# Patient Record
Sex: Female | Born: 1995 | Race: Black or African American | Hispanic: No | Marital: Single | Smoking: Former smoker
Health system: Southern US, Community
[De-identification: ages and names within clinical notes are randomized; demographics above are authoritative.]

## PROBLEM LIST (undated history)

## (undated) DIAGNOSIS — A6000 Herpesviral infection of urogenital system, unspecified: Secondary | ICD-10-CM

## (undated) DIAGNOSIS — Z789 Other specified health status: Secondary | ICD-10-CM

## (undated) DIAGNOSIS — F419 Anxiety disorder, unspecified: Secondary | ICD-10-CM

## (undated) DIAGNOSIS — O139 Gestational [pregnancy-induced] hypertension without significant proteinuria, unspecified trimester: Secondary | ICD-10-CM

## (undated) HISTORY — DX: Herpesviral infection of urogenital system, unspecified: A60.00

## (undated) HISTORY — PX: NO PAST SURGERIES: SHX2092

---

## 2014-06-17 LAB — OB RESULTS CONSOLE HIV ANTIBODY (ROUTINE TESTING): HIV: NONREACTIVE

## 2014-06-17 LAB — OB RESULTS CONSOLE GC/CHLAMYDIA
CHLAMYDIA, DNA PROBE: NEGATIVE
Gonorrhea: NEGATIVE

## 2014-06-17 LAB — OB RESULTS CONSOLE ABO/RH: RH Type: POSITIVE

## 2014-06-17 LAB — OB RESULTS CONSOLE RPR: RPR: NONREACTIVE

## 2014-06-17 LAB — OB RESULTS CONSOLE HEPATITIS B SURFACE ANTIGEN: Hepatitis B Surface Ag: NEGATIVE

## 2014-06-17 LAB — OB RESULTS CONSOLE RUBELLA ANTIBODY, IGM: RUBELLA: NON-IMMUNE/NOT IMMUNE

## 2014-06-17 LAB — OB RESULTS CONSOLE ANTIBODY SCREEN: ANTIBODY SCREEN: NEGATIVE

## 2014-06-28 DIAGNOSIS — N12 Tubulo-interstitial nephritis, not specified as acute or chronic: Secondary | ICD-10-CM

## 2014-06-28 HISTORY — DX: Tubulo-interstitial nephritis, not specified as acute or chronic: N12

## 2014-06-28 NOTE — L&D Delivery Note (Signed)
Dr. Richardson Dopp arrived to room and pt noted to be +3 station. With good pushing effort, she went on to deliver w/o the need for operative delivery.  Elevated BPs reviewed w/ Dr. Richardson Dopp. Preeclampsia labs WNL. Informed pt that per Dr. Richardson Dopp will monitor BPs closely in the immediate pp period. If remain elevated, will transfer to AICU for Magnesium Sulfate therapy, otherwise she will go to the postpartum floor. Pt. Verbalized understanding.  Delivery Note At 6:18 AM a viable female "Julie Finley" was delivered via Vaginal, Spontaneous Delivery by Dr. Richardson Dopp (Presentation: OA restituting to LOA).  APGARS: 9, 9; weight pending.   Placenta status: Intact, Spontaneous Sent to pathology due to calcification and marginal insertion.  Cord: 3 vessels with the following complications: None.  Cord pH: NA  Anesthesia: Epidural  Episiotomy: None Lacerations: Left Periurethral Suture Repair: 3.0 vicryl SH Est. Blood Loss (mL): 200  Mom to postpartum.  Baby to Couplet care / Skin to Skin.   Plans outpatient circumcision.  Sherre Scarlet 12/07/2014, 6:53 AM

## 2014-07-12 ENCOUNTER — Other Ambulatory Visit (HOSPITAL_COMMUNITY): Payer: Self-pay | Admitting: Obstetrics & Gynecology

## 2014-07-12 DIAGNOSIS — R772 Abnormality of alphafetoprotein: Secondary | ICD-10-CM

## 2014-07-18 ENCOUNTER — Ambulatory Visit (HOSPITAL_COMMUNITY)
Admission: RE | Admit: 2014-07-18 | Discharge: 2014-07-18 | Disposition: A | Discharge status: Home / Self Care | Payer: Medicaid Other | Source: Ambulatory Visit | Attending: Obstetrics & Gynecology | Admitting: Obstetrics & Gynecology

## 2014-07-18 ENCOUNTER — Encounter (HOSPITAL_COMMUNITY): Payer: Self-pay

## 2014-07-18 ENCOUNTER — Other Ambulatory Visit (HOSPITAL_COMMUNITY): Payer: Self-pay | Admitting: Obstetrics & Gynecology

## 2014-07-18 DIAGNOSIS — O283 Abnormal ultrasonic finding on antenatal screening of mother: Secondary | ICD-10-CM

## 2014-07-18 DIAGNOSIS — Z3A20 20 weeks gestation of pregnancy: Secondary | ICD-10-CM | POA: Insufficient documentation

## 2014-07-18 DIAGNOSIS — R772 Abnormality of alphafetoprotein: Secondary | ICD-10-CM | POA: Insufficient documentation

## 2014-07-18 DIAGNOSIS — O280 Abnormal hematological finding on antenatal screening of mother: Secondary | ICD-10-CM

## 2014-07-18 DIAGNOSIS — O289 Unspecified abnormal findings on antenatal screening of mother: Secondary | ICD-10-CM | POA: Insufficient documentation

## 2014-07-18 HISTORY — DX: Other specified health status: Z78.9

## 2014-07-22 ENCOUNTER — Other Ambulatory Visit (HOSPITAL_COMMUNITY): Payer: Self-pay

## 2014-07-23 ENCOUNTER — Other Ambulatory Visit (HOSPITAL_COMMUNITY): Payer: Self-pay

## 2014-07-23 ENCOUNTER — Telehealth (HOSPITAL_COMMUNITY): Payer: Self-pay | Admitting: MS"

## 2014-07-23 DIAGNOSIS — O280 Abnormal hematological finding on antenatal screening of mother: Secondary | ICD-10-CM | POA: Insufficient documentation

## 2014-07-23 NOTE — Progress Notes (Signed)
Genetic Counseling  High-Risk Gestation Note  Appointment Date:  07/18/14 Referred By: Konrad FelixKulwa, Ema Wakuru, MD Date of Birth:  05/12/1996 Partner:  Julie Finley   Pregnancy History: G1P0 Estimated Date of Delivery: 12/05/14 Estimated Gestational Age: [id number] Attending: Alpha GulaPaul Whitecar, MD  Julie Finley and her partner, Julie Finley, were seen for genetic counseling because of an increased risk for fetal Down syndrome based on Quad screening [facility]. They were accompanied by Mr. Finley's mother and Julie Finley's sister.   They were counseled regarding the Quad screen result and the associated 1 in 34 risk for fetal Down syndrome.  We reviewed chromosomes, nondisjunction, and the common features and variable prognosis of Down syndrome.  In addition, we reviewed the screen adjusted reduction in risks for trisomy 18 (1 in 2490) and ONTDs (less than 1 in 54,600).  We also discussed other explanations for a screen positive result including: a gestational dating error, differences in maternal metabolism, and normal variation. They understand that this screening is not diagnostic for Down syndrome but provides a risk assessment.  Additionally, we discussed that given that the level of one protein assessed on the Quad screen called unconjugated estriol (uE3) was lower than normal (0.16 MOM), the chance for a conditions called Rhea BeltonSmith Lemli Opitz syndrome (SLOS) is slightly increased.  SLOS is an autosomal recessive condition characterized by prenatal and postnatal growth restriction, microcephaly, moderate to severe intellectual disability, and congenital anomalies (including heart defects, underdeveloped external genitalia in males, postaxial polydactyly, cleft palate, and 2-3 toe syndactyly).  Some physical differences associated with SLOS may be detected prenatally, although ultrasound cannot detect all abnormalities, and there is variable expressivity in the condition. We briefly  reviewed the autosomal recessive inheritance of SLOS. Carrier screening for common mutations associated with SLOS is available as part of a pan-ethnic carrier screening panel. We reviewed that the detection rate for carriers of SLOS from this screen is approximately 67-69%. Thus, a negative carrier screen would not eliminate the risk for SLOS in a pregnancy.  Prenatal testing for this condition is also available via amniocentesis if desired.  Julie Finley declined carrier screening for SLOS at this time.   We reviewed available screening options for fetal aneuploidy including noninvasive prenatal screening (NIPS)/cell free DNA (cfDNA) testing, and detailed ultrasound.  They were counseled that screening tests are used to modify a patient's a priori risk for aneuploidy, typically based on age. This estimate provides a pregnancy specific risk assessment. We reviewed the benefits and limitations of each option. Specifically, we discussed the conditions for which each test screens, the detection rates, and false positive rates of each. They were also counseled regarding diagnostic testing via amniocentesis. We reviewed the approximate 1 in 300-500 risk for complications for amniocentesis, including spontaneous pregnancy loss.   After consideration of all the options, they elected to proceed with targeted ultrasound.  A complete ultrasound was performed today. The ultrasound report will be sent under separate cover.  Ambiguous genitalia was noted at the time of today's ultrasound. Normal bladder was visualized. Remaining visualized fetal anatomy appeared normal, but limited views of fetal heart were obtained. Complete ultrasound results reported separately. After careful consideration, Julie Finley elected to proceed with NIPS (Panorama) given the results from today's ultrasound. These results will be available in approximately 8-10 days.  Diagnostic testing was declined today.  She understands that screening tests  cannot rule out all birth defects or genetic syndromes. The patient was advised of this limitation and states  she still does not want additional testing at this time. Follow-up ultrasound was planned for 08/15/14.   Both family histories were reviewed and found to be noncontributory for birth defects, intellectual disability, and recurrent pregnancy loss. The father of the pregnancy's maternal half-brother reportedly has sickle cell trait. The father of the pregnancy reportedly does not have sickle cell trait. We do not have records to confirm this report at this time. Julie Finley was provided with written information regarding sickle cell anemia (SCA) including the carrier frequency and incidence in the African-American population, the availability of carrier testing and prenatal diagnosis if indicated.  In addition, we discussed that hemoglobinopathies are routinely screened for as part of the [facility] newborn screening panel.  Julie Finley planned to follow-up with her OB regarding whether or not hemoglobin electrophoresis was previously performed.  Without further information regarding the provided family history, an accurate genetic risk cannot be calculated. Further genetic counseling is warranted if more information is obtained.   Julie Finley denied exposure to environmental toxins or chemical agents. She denied the use of alcohol, tobacco or street drugs. She denied significant viral illnesses during the course of her pregnancy. Her medical and surgical histories were noncontributory.   I counseled this couple for approximately 40 minutes regarding the above risks and available options.   Quinn Plowman, MS,  Certified Genetic Counselor  07/23/2014

## 2014-07-23 NOTE — Telephone Encounter (Signed)
Left message for patient that I was reviewing information from her ultrasound and wanted to follow-up with her since I did not get a chance to at the end of her appointment. Also stated that the results of the labwork she elected to do are not back yet but will likely result end of this week or Monday. Stated that I will plan to call her back when this information is available. Left my number for patient to return my call.   Clydie BraunKaren Aziya Arena 07/23/2014 1:58 PM

## 2014-07-25 ENCOUNTER — Telehealth (HOSPITAL_COMMUNITY): Payer: Self-pay | Admitting: MS"

## 2014-07-25 ENCOUNTER — Other Ambulatory Visit (HOSPITAL_COMMUNITY): Payer: Self-pay

## 2014-07-25 NOTE — Telephone Encounter (Signed)
Patient called back with additional questions. She wanted to clarify the name of the condition we talked about earlier. Reviewed Smith-Lemli-Opitz and the two specific risk factors for this in the pregnancy. Patient wrote down the name so that she can look up additional information. Cautioned her about the wide array of information she may find and the variable features of the condition. She wanted to review the additional testing option to amniocentesis we talked about. Reviewed carrier screening for SLOS and that it can be informative but does not necessarily give as much direct information as amniocentesis. Reviewed that a normal carrier screen would not rule out that she is a carrier for SLOS but would reduce the chances. She also wanted to clarify why we discussed fetal echocardiogram. Reviewed the association with SLOS and heart defects and that fetal echocardiogram is another noninvasive screening tool available to assess for possible features of this condition. Encouraged her to write down questions as she thought of them and to call back if she has additional questions.   Julie Finley 07/25/2014 4:27 PM

## 2014-07-25 NOTE — Telephone Encounter (Signed)
Called Julie Finley to discuss her cell free DNA test results.  Ms. Julie CoilMegan Finley had Panorama testing through [facility].  Testing was offered because of abnormal Quad screen and ultrasound finding of ambiguous genitalia.   The patient was identified by name and DOB.  We reviewed that these are within normal limits, showing a less than 1 in 10,000 risk for trisomies 21, 18 and 13, and monosomy X (Turner syndrome).  In addition, the risk for triploidy/vanishing twin and sex chromosome trisomies (47,XXX and 47,XXY) was also low risk.  We reviewed that this testing identifies > 99% of pregnancies with trisomy 2921, trisomy 3913, sex chromosome trisomies (47,XXX and 47,XXY), and triploidy. The detection rate for trisomy 18 is 96%.  The detection rate for monosomy X is ~92%.  The false positive rate is <0.1% for all conditions. Testing was also consistent with female fetal sex.  The patient did wish to know fetal sex.  She understands that this testing does not identify all genetic conditions.    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 Discussed that follow-up ultrasound may give a clearer picture to the anatomy, but this also may not be determined until postnatal evaluation. I reviewed the low uE3 on the patient's Quad screen and the association with Smith-Lemli-Opitz syndrome (SLOS), which we briefly discussed when she was here. Discussed with Ms. Julie Finley that this condition can be associated with underdeveloped external genitalia in males, and thus, the ultrasound also raises the concern for this condition to be present. Reviewed that the maternal serum screen and ultrasound are not diagnostic for SLOS. We briefly reviewed the features of the condition and discussed that it is caused by deficiency of an enzyme that helps make the body's cholesterol, and thus, an individual with SLOS has low levels of  cholesterol, which is important for development of body tissues, including the brain. Discussed that various birth defects can be associated with it, such as congenital heart defects, and that often moderate to severe intellectual disability is present for these individuals. Given the association with heart defects, we discussed the option of scheduling a fetal echocardiogram as an additional screen in the pregnancy. Ms. Julie Finley stated that she was interested in a fetal echo. Our office will plan to facilitate this scheduling and contact the patient.   We discussed additional available screening and testing options for SLOS. We reviewed the option of amniocentesis in which 7-DHC levels (the enzyme that is deficient in SLOS) can be assessed in amniotic fluid. Expressed understanding that Ms. Julie Finley indicated earlier that she was not interested in amniocentesis in pregnancy given the associated risk of complications. Also discussed that given that SLOS is an autosomal recessive condition, carrier screening is available for the common disease causing mutations. Reviewed the limitations of carrier screening in that it is not diagnostic for the condition. Available carrier screening currently detects up to approximately 70% of carriers. Thus, a negative carrier screen would not rule out the presence of SLOS. Alternatively, we discussed that postnatal analysis for SLOS can be performed for the baby. Offered the patient follow-up genetic counseling to review this information in more detail. Ms. Julie Finley expressed understanding of this information. She stated that she needed to discuss all of this with her boyfriend prior to making a decision about additional testing for SLOS but would like us to make the appointment for fetal echocardiogram.  All questions were answered to her satisfaction, she  was encouraged to call with additional questions or concerns.  Quinn Plowman, MS Certified Genetic  Counselor 07/25/2014 9:38 AM

## 2014-08-15 ENCOUNTER — Encounter (HOSPITAL_COMMUNITY): Payer: Self-pay

## 2014-08-15 ENCOUNTER — Ambulatory Visit (HOSPITAL_COMMUNITY)
Admission: RE | Admit: 2014-08-15 | Discharge: 2014-08-15 | Disposition: A | Discharge status: Home / Self Care | Payer: Medicaid Other | Source: Ambulatory Visit | Attending: Obstetrics & Gynecology | Admitting: Obstetrics & Gynecology

## 2014-08-15 DIAGNOSIS — O283 Abnormal ultrasonic finding on antenatal screening of mother: Secondary | ICD-10-CM | POA: Diagnosis not present

## 2014-08-15 DIAGNOSIS — O99212 Obesity complicating pregnancy, second trimester: Secondary | ICD-10-CM | POA: Insufficient documentation

## 2014-08-15 DIAGNOSIS — O358XX0 Maternal care for other (suspected) fetal abnormality and damage, not applicable or unspecified: Secondary | ICD-10-CM | POA: Diagnosis present

## 2014-08-15 DIAGNOSIS — Z3A24 24 weeks gestation of pregnancy: Secondary | ICD-10-CM | POA: Diagnosis not present

## 2014-08-15 DIAGNOSIS — O280 Abnormal hematological finding on antenatal screening of mother: Secondary | ICD-10-CM

## 2014-08-15 DIAGNOSIS — R772 Abnormality of alphafetoprotein: Secondary | ICD-10-CM

## 2014-08-29 ENCOUNTER — Other Ambulatory Visit (HOSPITAL_COMMUNITY): Payer: Self-pay

## 2014-09-10 LAB — OB RESULTS CONSOLE RPR: RPR: NONREACTIVE

## 2014-09-12 ENCOUNTER — Ambulatory Visit (HOSPITAL_COMMUNITY)
Admission: RE | Admit: 2014-09-12 | Discharge: 2014-09-12 | Disposition: A | Discharge status: Home / Self Care | Payer: Medicaid Other | Source: Ambulatory Visit | Attending: Obstetrics & Gynecology | Admitting: Obstetrics & Gynecology

## 2014-09-12 ENCOUNTER — Other Ambulatory Visit (HOSPITAL_COMMUNITY): Payer: Self-pay | Admitting: Maternal and Fetal Medicine

## 2014-09-12 DIAGNOSIS — O283 Abnormal ultrasonic finding on antenatal screening of mother: Secondary | ICD-10-CM | POA: Insufficient documentation

## 2014-09-12 DIAGNOSIS — O358XX0 Maternal care for other (suspected) fetal abnormality and damage, not applicable or unspecified: Secondary | ICD-10-CM | POA: Insufficient documentation

## 2014-09-12 DIAGNOSIS — Z3A28 28 weeks gestation of pregnancy: Secondary | ICD-10-CM | POA: Insufficient documentation

## 2014-09-12 DIAGNOSIS — O280 Abnormal hematological finding on antenatal screening of mother: Secondary | ICD-10-CM

## 2014-10-10 ENCOUNTER — Other Ambulatory Visit (HOSPITAL_COMMUNITY): Payer: Self-pay | Admitting: Maternal and Fetal Medicine

## 2014-10-10 ENCOUNTER — Ambulatory Visit (HOSPITAL_COMMUNITY)
Admission: RE | Admit: 2014-10-10 | Discharge: 2014-10-10 | Disposition: A | Discharge status: Home / Self Care | Payer: Medicaid Other | Source: Ambulatory Visit | Attending: Obstetrics & Gynecology | Admitting: Obstetrics & Gynecology

## 2014-10-10 DIAGNOSIS — O283 Abnormal ultrasonic finding on antenatal screening of mother: Secondary | ICD-10-CM | POA: Diagnosis present

## 2014-10-10 DIAGNOSIS — O358XX0 Maternal care for other (suspected) fetal abnormality and damage, not applicable or unspecified: Secondary | ICD-10-CM | POA: Diagnosis not present

## 2014-10-10 DIAGNOSIS — Z3A32 32 weeks gestation of pregnancy: Secondary | ICD-10-CM | POA: Insufficient documentation

## 2014-10-10 DIAGNOSIS — O280 Abnormal hematological finding on antenatal screening of mother: Secondary | ICD-10-CM

## 2014-10-10 DIAGNOSIS — O99213 Obesity complicating pregnancy, third trimester: Secondary | ICD-10-CM | POA: Insufficient documentation

## 2014-11-07 ENCOUNTER — Ambulatory Visit (HOSPITAL_COMMUNITY): Payer: Medicaid Other

## 2014-11-13 ENCOUNTER — Ambulatory Visit (HOSPITAL_COMMUNITY)
Admission: RE | Admit: 2014-11-13 | Discharge: 2014-11-13 | Disposition: A | Discharge status: Home / Self Care | Payer: Medicaid Other | Source: Ambulatory Visit | Attending: Obstetrics & Gynecology | Admitting: Obstetrics & Gynecology

## 2014-11-13 DIAGNOSIS — O99213 Obesity complicating pregnancy, third trimester: Secondary | ICD-10-CM | POA: Insufficient documentation

## 2014-11-13 DIAGNOSIS — O283 Abnormal ultrasonic finding on antenatal screening of mother: Secondary | ICD-10-CM | POA: Diagnosis not present

## 2014-11-13 DIAGNOSIS — Z3A36 36 weeks gestation of pregnancy: Secondary | ICD-10-CM | POA: Diagnosis not present

## 2014-11-13 DIAGNOSIS — O358XX0 Maternal care for other (suspected) fetal abnormality and damage, not applicable or unspecified: Secondary | ICD-10-CM | POA: Insufficient documentation

## 2014-11-13 DIAGNOSIS — O280 Abnormal hematological finding on antenatal screening of mother: Secondary | ICD-10-CM

## 2014-11-13 LAB — OB RESULTS CONSOLE GC/CHLAMYDIA
Chlamydia: NEGATIVE
Gonorrhea: NEGATIVE

## 2014-11-13 LAB — OB RESULTS CONSOLE GBS: GBS: NEGATIVE

## 2014-12-06 ENCOUNTER — Encounter (HOSPITAL_COMMUNITY): Payer: Self-pay | Admitting: *Deleted

## 2014-12-06 ENCOUNTER — Inpatient Hospital Stay (HOSPITAL_COMMUNITY)
Admission: AD | Admit: 2014-12-06 | Discharge: 2014-12-09 | DRG: 774 | Disposition: A | Discharge status: Home / Self Care | Payer: Medicaid Other | Source: Ambulatory Visit | Attending: Obstetrics and Gynecology | Admitting: Obstetrics and Gynecology

## 2014-12-06 ENCOUNTER — Inpatient Hospital Stay (HOSPITAL_COMMUNITY): Payer: Medicaid Other | Admitting: Anesthesiology

## 2014-12-06 DIAGNOSIS — O4290 Premature rupture of membranes, unspecified as to length of time between rupture and onset of labor, unspecified weeks of gestation: Secondary | ICD-10-CM | POA: Diagnosis present

## 2014-12-06 DIAGNOSIS — Z3A40 40 weeks gestation of pregnancy: Secondary | ICD-10-CM | POA: Diagnosis present

## 2014-12-06 DIAGNOSIS — O1493 Unspecified pre-eclampsia, third trimester: Secondary | ICD-10-CM | POA: Diagnosis present

## 2014-12-06 LAB — CBC
HCT: 36.9 % (ref 36.0–46.0)
Hemoglobin: 12.4 g/dL (ref 12.0–15.0)
MCH: 26.1 pg (ref 26.0–34.0)
MCHC: 33.6 g/dL (ref 30.0–36.0)
MCV: 77.7 fL — AB (ref 78.0–100.0)
Platelets: 322 10*3/uL (ref 150–400)
RBC: 4.75 MIL/uL (ref 3.87–5.11)
RDW: 16.2 % — ABNORMAL HIGH (ref 11.5–15.5)
WBC: 11.1 10*3/uL — ABNORMAL HIGH (ref 4.0–10.5)

## 2014-12-06 LAB — POCT FERN TEST: POCT FERN TEST: NEGATIVE

## 2014-12-06 LAB — ABO/RH: ABO/RH(D): O POS

## 2014-12-06 LAB — TYPE AND SCREEN
ABO/RH(D): O POS
Antibody Screen: NEGATIVE

## 2014-12-06 LAB — AMNISURE RUPTURE OF MEMBRANE (ROM) NOT AT ARMC: AMNISURE: POSITIVE

## 2014-12-06 MED ORDER — OXYCODONE-ACETAMINOPHEN 5-325 MG PO TABS: 2.0000 | ORAL_TABLET | ORAL | Status: DC | PRN

## 2014-12-06 MED ORDER — EPHEDRINE 5 MG/ML INJ
10.0000 mg | INTRAVENOUS | Status: DC | PRN
  Filled 2014-12-06: qty 2

## 2014-12-06 MED ORDER — OXYTOCIN BOLUS FROM INFUSION
500.0000 mL | INTRAVENOUS | Status: DC
  Administered 2014-12-07: 500 mL via INTRAVENOUS

## 2014-12-06 MED ORDER — DIPHENHYDRAMINE HCL 50 MG/ML IJ SOLN: 12.5000 mg | INTRAMUSCULAR | Status: DC | PRN

## 2014-12-06 MED ORDER — OXYTOCIN 40 UNITS IN LACTATED RINGERS INFUSION - SIMPLE MED
62.5000 mL/h | INTRAVENOUS | Status: DC
  Administered 2014-12-07: 62.5 mL/h via INTRAVENOUS

## 2014-12-06 MED ORDER — PHENYLEPHRINE 40 MCG/ML (10ML) SYRINGE FOR IV PUSH (FOR BLOOD PRESSURE SUPPORT)
80.0000 ug | PREFILLED_SYRINGE | INTRAVENOUS | Status: DC | PRN
  Filled 2014-12-06: qty 20
  Filled 2014-12-06: qty 2

## 2014-12-06 MED ORDER — LIDOCAINE HCL (PF) 1 % IJ SOLN
30.0000 mL | INTRAMUSCULAR | Status: DC | PRN
  Filled 2014-12-06: qty 30

## 2014-12-06 MED ORDER — LACTATED RINGERS IV SOLN
INTRAVENOUS | Status: DC
Start: 2014-12-06 — End: 2014-12-07
  Administered 2014-12-06 – 2014-12-07 (×2): via INTRAVENOUS

## 2014-12-06 MED ORDER — BUPIVACAINE HCL (PF) 0.25 % IJ SOLN
INTRAMUSCULAR | Status: DC | PRN
  Administered 2014-12-06 (×2): 4 mL

## 2014-12-06 MED ORDER — FENTANYL 2.5 MCG/ML BUPIVACAINE 1/10 % EPIDURAL INFUSION (WH - ANES)
14.0000 mL/h | INTRAMUSCULAR | Status: DC | PRN
  Administered 2014-12-06: 12 mL/h via EPIDURAL
  Administered 2014-12-07: 14 mL/h via EPIDURAL
  Filled 2014-12-06 (×2): qty 125

## 2014-12-06 MED ORDER — OXYTOCIN 40 UNITS IN LACTATED RINGERS INFUSION - SIMPLE MED
1.0000 m[IU]/min | INTRAVENOUS | Status: DC
  Administered 2014-12-06: 2 m[IU]/min via INTRAVENOUS
  Filled 2014-12-06: qty 1000

## 2014-12-06 MED ORDER — TERBUTALINE SULFATE 1 MG/ML IJ SOLN: 0.2500 mg | Freq: Once | INTRAMUSCULAR | Status: AC | PRN

## 2014-12-06 MED ORDER — LIDOCAINE-EPINEPHRINE (PF) 2 %-1:200000 IJ SOLN
INTRAMUSCULAR | Status: DC | PRN
  Administered 2014-12-06: 3 mL

## 2014-12-06 MED ORDER — ACETAMINOPHEN 325 MG PO TABS: 650.0000 mg | ORAL_TABLET | ORAL | Status: DC | PRN

## 2014-12-06 MED ORDER — NALBUPHINE HCL 10 MG/ML IJ SOLN
10.0000 mg | INTRAMUSCULAR | Status: DC | PRN
  Administered 2014-12-06: 10 mg via INTRAVENOUS
  Filled 2014-12-06: qty 1

## 2014-12-06 MED ORDER — ONDANSETRON HCL 4 MG/2ML IJ SOLN: 4.0000 mg | Freq: Four times a day (QID) | INTRAMUSCULAR | Status: DC | PRN

## 2014-12-06 MED ORDER — OXYCODONE-ACETAMINOPHEN 5-325 MG PO TABS: 1.0000 | ORAL_TABLET | ORAL | Status: DC | PRN

## 2014-12-06 MED ORDER — LACTATED RINGERS IV SOLN
500.0000 mL | INTRAVENOUS | Status: DC | PRN
  Administered 2014-12-06 – 2014-12-07 (×2): 500 mL via INTRAVENOUS

## 2014-12-06 MED ORDER — CITRIC ACID-SODIUM CITRATE 334-500 MG/5ML PO SOLN
30.0000 mL | ORAL | Status: DC | PRN
  Filled 2014-12-06: qty 30

## 2014-12-06 NOTE — H&P (Signed)
Julie Finley is a 19 y.o. female, G1 P0 at 40.1 weeks  Patient Active Problem List   Diagnosis Date Noted  . [redacted] weeks gestation of pregnancy   . [redacted] weeks gestation of pregnancy   . [redacted] weeks gestation of pregnancy   . Abnormal MSAFP (maternal serum alpha-fetoprotein), decreased   . Abnormal AFP3 test   . Abnormal fetal ultrasound   . [redacted] weeks gestation of pregnancy     Pregnancy Course: Patient entered care at 18.1 weeks.   EDC of 12/05/14 was established by LMP.      [place] evaluations:     Significant prenatal events:   Morbid obesity, amenia, abn quad   Last evaluation:   39.5 weeks   VE:0/50/-3 on  Reason for admission:  labor  Pt States:   Contractions Frequency: 3-4         Contraction severity: strong         Fetal activity: +FM  OB History    Gravida Para Term Preterm AB TAB SAB Ectopic Multiple Living   1              Past Medical History  Diagnosis Date  . Medical history non-contributory    Past Surgical History  Procedure Laterality Date  . No past surgeries     Family History: family history is not on file. Social History:  reports that she has never smoked. She does not have any smokeless tobacco history on file. She reports that she does not drink alcohol or use illicit drugs.   Prenatal Transfer Tool  Maternal Diabetes: No Genetic Screening: Abnormal:  Results: Other:high risk for downs, AFP low risk,low panarama Maternal Ultrasounds/Referrals:  Fetal Ultrasounds or other Referrals:   Maternal Substance Abuse:   Significant Maternal Medications:   Significant Maternal Lab Results:    ROS:  See HPI above, all other systems are negative  No Known Allergies  Dilation: 3 Effacement (%): 70 Station: -2 Exam by:: Janeth Rase Blood pressure 144/72, pulse 80, temperature 98.3 F (36.8 C), temperature source Oral, resp. rate 20, height  (1.651 m), weight 242 lb (109.77 kg), last menstrual period 02/28/2014.  Maternal Exam:  Uterine  Assessment: Contraction frequency is rare.  Abdomen: Gravid, non tender. Fundal height is aga.  Normal external genitalia, vulva, cervix, uterus and adnexa.  No lesions noted on exam.  Pelvis adequate for delivery.  Fetal presentation:   Fetal Exam:  Monitor Surveillance : Continuous Monitoring Mode: Ultrasound.  NICHD: Category CTXs:  EFW   7 lbs  Physical Exam: Nursing note and vitals reviewed General: alert and cooperative She appears well nourished Psychiatric: Normal mood and affect. Her behavior is normal Head: Normocephalic Eyes: Pupils are equal, round, and reactive to light Neck: Normal range of motion Cardiovascular: RRR without murmur  Respiratory: CTAB. Effort normal  Abd: soft, non-tender, +BS, no rebound, no guarding  Genitourinary: Vagina normal  Neurological: A&Ox3 Skin: Warm and dry  Musculoskeletal: Normal range of motion  Homan's sign negative bilaterally No evidence of DVTs.  Edema: Minimal bilaterally non-pitting edema DTR: 2+ Clonus: None   Prenatal labs: ABO, Rh: O/--/-- (12/21 0000) Antibody:  negative Rubella:   immune RPR: Nonreactive (03/15 0000)  HBsAg:   negativeHIV:   NR GBS: Negative (05/18 0000) Sickle cell/Hgb electrophoresis:  WNL Pap:   GC:    Chlamydia:  Genetic screenings:   Glucola:    Assessment:  IUP at 40.1 weeks  NICHD: Category Membranes:  Bishop Score:  GBS  Plan:  Admit to L&D for expectant/active management of labor. Possible augmentation options reviewed including foley bulb, AROM and/or pitocin.   GBS prophylaxis with PCN G per Fredrica Capano dosing with onset of active labor.  IV pain medication per orders PRN Epidural per patient request Foley cath after patient is comfortable with epidural Anticipate SVD  Labor mgmt as ordered   Okay to ambulate around unit with wireless monitors  Okay to get up and shower without monitoring   May auscultate FHR intermittently,  if expectant management     q 30 min  in active labor - x 5 minutes     q 15 min in transition - before during and after a ctx     q 5 min with pushing - before during and after a ctx.     May ambulate without monitoring.     If no active labor, may do NST q 2 hours.   Attending MD available at all times.  Fronia Depass, CNM, MSN 12/06/2014, 6:22 PM

## 2014-12-06 NOTE — MAU Note (Signed)
Patient presents to MAU with c/o contractions every 4-5 minutes since 3pm today. Denies LOF, VB. +FM. States was 1cm at last office visit.

## 2014-12-06 NOTE — MAU Note (Signed)
Contractions started this morning around 0600. Now every 4-5 min. No bleeding or leaking.  No problems with preg. Neg GBS, last time checked she was a FT

## 2014-12-06 NOTE — Anesthesia Procedure Notes (Signed)
Epidural Patient location during procedure: OB  Staffing Anesthesiologist: Devion Chriscoe, CHRIS Performed by: anesthesiologist   Preanesthetic Checklist Completed: patient identified, surgical consent, pre-op evaluation, timeout performed, IV checked, risks and benefits discussed and monitors and equipment checked  Epidural Patient position: sitting Prep: site prepped and draped and DuraPrep Patient monitoring: heart rate, cardiac monitor, continuous pulse ox and blood pressure Approach: midline Location: L3-L4 Injection technique: LOR saline  Needle:  Needle type: Tuohy  Needle gauge: 17 G Needle length: 9 cm Needle insertion depth: 8 cm Catheter type: closed end flexible Catheter size: 19 Gauge Catheter at skin depth: 14 cm Test dose: negative and 2% lidocaine with Epi 1:200 K  Assessment Events: blood not aspirated, injection not painful, no injection resistance, negative IV test and no paresthesia  Additional Notes H+P and labs checked, risks and benefits discussed with the patient, consent obtained, procedure tolerated well and without complications.  Reason for block:procedure for pain   

## 2014-12-06 NOTE — Progress Notes (Addendum)
Subjective: In to make the acquaintance of this 19 yo G1P0 @ 40.1 wks admitted in latent labor, also w/ PROM. +FM. Denies bleeding. Parents at bedside. FOB scheduled to arrive after work.  Denies h/a, visual disturbances, RUQ pain, CP, SOB, weakness or severe N/V.  Objective: BP 134/77 mmHg  Pulse 85  Temp(Src) 98 F (36.7 C) (Oral)  Resp 18  Ht 5' 5"  (1.651 m)  Wt 109.77 kg (242 lb)  BMI 40.27 kg/m2  LMP 02/28/2014     Today's Vitals   12/06/14 1718 12/06/14 1934 12/06/14 2052 12/06/14 2057  BP:  164/99  134/77  Pulse:  90  85  Temp:  98 F (36.7 C)    TempSrc:  Oral    Resp:  18  18  Height:      Weight:      PainSc: 8  7  8      Results for orders placed or performed during the hospital encounter of 12/06/14 (from the past 24 hour(s))  Amnisure rupture of membrane (rom)not at [facility]     Status: None   Collection Time: 12/06/14  6:03 PM  Result Value Ref Range   Amnisure ROM POSITIVE   Fern Test     Status: None   Collection Time: 12/06/14  6:06 PM  Result Value Ref Range   POCT Fern Test Negative = intact amniotic membranes   CBC     Status: Abnormal   Collection Time: 12/06/14  6:50 PM  Result Value Ref Range   WBC 11.1 (H) 4.0 - 10.5 K/uL   RBC 4.75 3.87 - 5.11 MIL/uL   Hemoglobin 12.4 12.0 - 15.0 g/dL   HCT 36.9 36.0 - 46.0 %   MCV 77.7 (L) 78.0 - 100.0 fL   MCH 26.1 26.0 - 34.0 pg   MCHC 33.6 30.0 - 36.0 g/dL   RDW 16.2 (H) 11.5 - 15.5 %   Platelets 322 150 - 400 K/uL  Type and screen     Status: None   Collection Time: 12/06/14  6:50 PM  Result Value Ref Range   ABO/RH(D) O POS    Antibody Screen NEG    Sample Expiration 12/09/2014   ABO/Rh     Status: None   Collection Time: 12/06/14  6:50 PM  Result Value Ref Range   ABO/RH(D) O POS    Gen: NAD Lungs: CTAB CV: RRR w/o M/R/G Abdomen: gravid, NTND FHT: BL 110s w/ minimal variability, no accels and no decels - recent Nubain dose @ 20:53 PM. UC:   irregular, every 6-7 minutes Ext: 1+ calf/ankle  edema, 1+ DTRs, no clonus SVE:   Dilation: 3 Effacement (%): 70 Station: -2 Exam by:: L.Mears,RN  Assessment:  IUP at 40.1 wks. ROM x 3 hrs; no concerns for infection.  Elevated DS risk on Quad screen (1:34) -- normal cell free DNA -- normal fetal echo -- fetus w/ likely hypospadias. Suspect elevated BPs due to inadequate pain control. Will follow closely. Consider preeclampsia labs if consistently elevated BPs after adequate pain control. Cat 2 FHRT likely due to recent Nubain dose - mom placed in left lateral decubitus position and given fluid bolus. Rubella non-immune. Obesity.  Plan: Reviewed R&B of augmentation with patient (who was lucid) and parents, including need for serial induction, risk of C/S or need for further intervention. Patient and parents seem to understand these risks and are agreeable with proceeding. Questions from pt answered to her satisfaction. Will ensure Cat 1 tracing prior to starting Pitocin.  May have epidural before starting Pitocin.  Consult prn. Expect progress and SVD. Offer MMR pp. Infant to have f/u pp.  Farrel Gordon CNM 12/06/2014, 9:53 PM

## 2014-12-06 NOTE — Anesthesia Preprocedure Evaluation (Signed)
Anesthesia Evaluation  Patient identified by MRN, date of birth, ID band Patient awake    Reviewed: Allergy & Precautions, NPO status , Patient's Chart, lab work & pertinent test results  History of Anesthesia Complications Negative for: history of anesthetic complications  Airway Mallampati: II  TM Distance: >3 FB Neck ROM: Full    Dental  (+) Teeth Intact   Pulmonary neg pulmonary ROS,  breath sounds clear to auscultation        Cardiovascular negative cardio ROS  Rhythm:Regular     Neuro/Psych negative neurological ROS     GI/Hepatic negative GI ROS, Neg liver ROS,   Endo/Other  Morbid obesity  Renal/GU negative Renal ROS     Musculoskeletal   Abdominal   Peds  Hematology negative hematology ROS (+)   Anesthesia Other Findings   Reproductive/Obstetrics (+) Pregnancy                             Anesthesia Physical Anesthesia Plan  ASA: II  Anesthesia Plan: Epidural   Post-op Pain Management:    Induction:   Airway Management Planned:   Additional Equipment:   Intra-op Plan:   Post-operative Plan:   Informed Consent: I have reviewed the patients History and Physical, chart, labs and discussed the procedure including the risks, benefits and alternatives for the proposed anesthesia with the patient or authorized representative who has indicated his/her understanding and acceptance.     Plan Discussed with: Anesthesiologist  Anesthesia Plan Comments:         Anesthesia Quick Evaluation

## 2014-12-06 NOTE — MAU Provider Note (Signed)
Julie Finley is a 19 y.o. G1P0 at 40.1 weeks presented to MAU c/o ctx since 1500.  She denies vb,lof w/+FM.   History     Patient Active Problem List   Diagnosis Date Noted  . [redacted] weeks gestation of pregnancy   . [redacted] weeks gestation of pregnancy   . [redacted] weeks gestation of pregnancy   . Abnormal MSAFP (maternal serum alpha-fetoprotein), decreased   . Abnormal AFP3 test   . Abnormal fetal ultrasound   . [redacted] weeks gestation of pregnancy     Chief Complaint  Patient presents with  . Labor Eval   HPI  OB History    Gravida Para Term Preterm AB TAB SAB Ectopic Multiple Living   1               Past Medical History  Diagnosis Date  . Medical history non-contributory     Past Surgical History  Procedure Laterality Date  . No past surgeries      History reviewed. No pertinent family history.  History  Substance Use Topics  . Smoking status: Never Smoker   . Smokeless tobacco: Not on file  . Alcohol Use: No    Allergies: No Known Allergies  Prescriptions prior to admission  Medication Sig Dispense Refill Last Dose  . diphenhydrAMINE (BENADRYL) 25 MG tablet Take 25 mg by mouth every 6 (six) hours as needed for allergies.   Past Week at Unknown time  . Prenatal Vit-Fe Fumarate-FA (PRENATAL VITAMIN PO) Take 1 tablet by mouth daily.    12/06/2014 at Unknown time    ROS See HPI above, all other systems are negative  Physical Exam   Blood pressure 144/72, pulse 80, temperature 98.3 F (36.8 C), temperature source Oral, resp. rate 20, height 5\' 5"  (1.651 m), weight 242 lb (109.77 kg), last menstrual period 02/28/2014.  Physical Exam Ext:  WNL ABD: Soft, non tender to palpation, no rebound or guarding SVE: 3/70/-2   ED Course  Assessment: IUP at  40.1weeks Membranes: intact FHR: Category 1 CTX:  q 4-5 minutes   Plan: Observe for 1 hour then recheck   Jersie Beel, CNM, MSN 12/06/2014. 5:45pm    Addendum 6:10 Pt thinks her water broke Amnisure  sent   MAU Addendum Note  Results for orders placed or performed during the hospital encounter of 12/06/14 (from the past 24 hour(s))  Amnisure rupture of membrane (rom)not at [facility]     Status: None   Collection Time: 12/06/14  6:03 PM  Result Value Ref Range   Amnisure ROM POSITIVE   Fern Test     Status: None   Collection Time: 12/06/14  6:06 PM  Result Value Ref Range   POCT Fern Test Negative = intact amniotic membranes      Plan: Admit to L&D with expectant management   Mahamed Zalewski, CNM, MSN 12/06/2014. 6:37 PM

## 2014-12-07 ENCOUNTER — Encounter (HOSPITAL_COMMUNITY): Payer: Self-pay | Admitting: *Deleted

## 2014-12-07 LAB — COMPREHENSIVE METABOLIC PANEL
ALK PHOS: 160 U/L — AB (ref 38–126)
ALT: 14 U/L (ref 14–54)
ALT: 13 U/L — AB (ref 14–54)
AST: 18 U/L (ref 15–41)
AST: 20 U/L (ref 15–41)
Albumin: 2.7 g/dL — ABNORMAL LOW (ref 3.5–5.0)
Albumin: 2.4 g/dL — ABNORMAL LOW (ref 3.5–5.0)
Alkaline Phosphatase: 184 U/L — ABNORMAL HIGH (ref 38–126)
Anion gap: 6 (ref 5–15)
Anion gap: 5 (ref 5–15)
BILIRUBIN TOTAL: 0.2 mg/dL — AB (ref 0.3–1.2)
BUN: 6 mg/dL (ref 6–20)
BUN: 6 mg/dL (ref 6–20)
CALCIUM: 8.7 mg/dL — AB (ref 8.9–10.3)
CO2: 20 mmol/L — AB (ref 22–32)
CO2: 20 mmol/L — AB (ref 22–32)
Calcium: 8.9 mg/dL (ref 8.9–10.3)
Chloride: 109 mmol/L (ref 101–111)
Chloride: 112 mmol/L — ABNORMAL HIGH (ref 101–111)
Creatinine, Ser: 0.63 mg/dL (ref 0.44–1.00)
Creatinine, Ser: 0.62 mg/dL (ref 0.44–1.00)
GFR calc Af Amer: >60 mL/min (ref >60)
GFR calc non Af Amer: >60 mL/min (ref >60)
GLUCOSE: 95 mg/dL (ref 65–99)
GLUCOSE: 99 mg/dL (ref 65–99)
POTASSIUM: 3.6 mmol/L (ref 3.5–5.1)
Potassium: 3.6 mmol/L (ref 3.5–5.1)
SODIUM: 135 mmol/L (ref 135–145)
Sodium: 137 mmol/L (ref 135–145)
TOTAL PROTEIN: 6.1 g/dL — AB (ref 6.5–8.1)
Total Protein: 5.4 g/dL — ABNORMAL LOW (ref 6.5–8.1)

## 2014-12-07 LAB — CBC WITH DIFFERENTIAL/PLATELET
Basophils Absolute: 0 10*3/uL (ref 0.0–0.1)
Basophils Relative: 0 % (ref 0–1)
EOS ABS: 0.1 10*3/uL (ref 0.0–0.7)
Eosinophils Relative: 1 % (ref 0–5)
HCT: 33.1 % — ABNORMAL LOW (ref 36.0–46.0)
HEMOGLOBIN: 11 g/dL — AB (ref 12.0–15.0)
LYMPHS ABS: 2.3 10*3/uL (ref 0.7–4.0)
Lymphocytes Relative: 19 % (ref 12–46)
MCH: 25.3 pg — ABNORMAL LOW (ref 26.0–34.0)
MCHC: 33.2 g/dL (ref 30.0–36.0)
MCV: 76.3 fL — ABNORMAL LOW (ref 78.0–100.0)
MONOS PCT: 8 % (ref 3–12)
Monocytes Absolute: 1 10*3/uL (ref 0.1–1.0)
Neutro Abs: 8.8 10*3/uL — ABNORMAL HIGH (ref 1.7–7.7)
Neutrophils Relative %: 72 % (ref 43–77)
Platelets: 271 10*3/uL (ref 150–400)
RBC: 4.34 MIL/uL (ref 3.87–5.11)
RDW: 16.1 % — AB (ref 11.5–15.5)
WBC: 12.2 10*3/uL — ABNORMAL HIGH (ref 4.0–10.5)

## 2014-12-07 LAB — PROTEIN / CREATININE RATIO, URINE
CREATININE, URINE: 157 mg/dL
Creatinine, Urine: 26 mg/dL
PROTEIN CREATININE RATIO: 0.92 mg/mg{creat} — AB (ref 0.00–0.15)
Protein Creatinine Ratio: 0.2 mg/mg{Cre} — ABNORMAL HIGH (ref 0.00–0.15)
Total Protein, Urine: 32 mg/dL
Total Protein, Urine: 24 mg/dL

## 2014-12-07 LAB — CBC
HCT: 36.2 % (ref 36.0–46.0)
HEMOGLOBIN: 12.5 g/dL (ref 12.0–15.0)
MCH: 26.9 pg (ref 26.0–34.0)
MCHC: 34.5 g/dL (ref 30.0–36.0)
MCV: 77.8 fL — AB (ref 78.0–100.0)
Platelets: 295 10*3/uL (ref 150–400)
RBC: 4.65 MIL/uL (ref 3.87–5.11)
RDW: 16.3 % — ABNORMAL HIGH (ref 11.5–15.5)
WBC: 9.9 10*3/uL (ref 4.0–10.5)

## 2014-12-07 LAB — LACTATE DEHYDROGENASE
LDH: 206 U/L — AB (ref 98–192)
LDH: 202 U/L — AB (ref 98–192)

## 2014-12-07 LAB — URIC ACID
Uric Acid, Serum: 5.2 mg/dL (ref 2.3–6.6)
Uric Acid, Serum: 5.3 mg/dL (ref 2.3–6.6)

## 2014-12-07 LAB — MRSA PCR SCREENING: MRSA by PCR: NEGATIVE

## 2014-12-07 LAB — RPR: RPR Ser Ql: NONREACTIVE

## 2014-12-07 LAB — HIV ANTIBODY (ROUTINE TESTING W REFLEX): HIV Screen 4th Generation wRfx: NONREACTIVE

## 2014-12-07 MED ORDER — WITCH HAZEL-GLYCERIN EX PADS
1.0000 | MEDICATED_PAD | CUTANEOUS | Status: DC | PRN
Start: 2014-12-07 — End: 2014-12-09

## 2014-12-07 MED ORDER — MAGNESIUM SULFATE 50 % IJ SOLN
2.0000 g/h | INTRAMUSCULAR | Status: AC
  Administered 2014-12-08: 2 g/h via INTRAVENOUS
  Filled 2014-12-07 (×2): qty 80

## 2014-12-07 MED ORDER — LANOLIN HYDROUS EX OINT: TOPICAL_OINTMENT | CUTANEOUS | Status: DC | PRN

## 2014-12-07 MED ORDER — TETANUS-DIPHTH-ACELL PERTUSSIS 5-2.5-18.5 LF-MCG/0.5 IM SUSP
0.5000 mL | Freq: Once | INTRAMUSCULAR | Status: DC
  Filled 2014-12-07: qty 0.5

## 2014-12-07 MED ORDER — ONDANSETRON HCL 4 MG PO TABS: 4.0000 mg | ORAL_TABLET | ORAL | Status: DC | PRN

## 2014-12-07 MED ORDER — PRENATAL MULTIVITAMIN CH
1.0000 | ORAL_TABLET | Freq: Every day | ORAL | Status: DC
  Administered 2014-12-07 – 2014-12-09 (×3): 1 via ORAL
  Filled 2014-12-07 (×3): qty 1

## 2014-12-07 MED ORDER — OXYCODONE-ACETAMINOPHEN 5-325 MG PO TABS: 1.0000 | ORAL_TABLET | ORAL | Status: DC | PRN

## 2014-12-07 MED ORDER — ACETAMINOPHEN 325 MG PO TABS
650.0000 mg | ORAL_TABLET | ORAL | Status: DC | PRN
Start: 2014-12-07 — End: 2014-12-09

## 2014-12-07 MED ORDER — MAGNESIUM SULFATE BOLUS VIA INFUSION
4.0000 g | Freq: Once | INTRAVENOUS | Status: AC
  Administered 2014-12-07: 4 g via INTRAVENOUS
  Filled 2014-12-07: qty 500

## 2014-12-07 MED ORDER — SENNOSIDES-DOCUSATE SODIUM 8.6-50 MG PO TABS
2.0000 | ORAL_TABLET | ORAL | Status: DC
  Administered 2014-12-08: 2 via ORAL
  Filled 2014-12-07: qty 2

## 2014-12-07 MED ORDER — DIPHENHYDRAMINE HCL 25 MG PO CAPS: 25.0000 mg | ORAL_CAPSULE | Freq: Four times a day (QID) | ORAL | Status: DC | PRN

## 2014-12-07 MED ORDER — IBUPROFEN 600 MG PO TABS
600.0000 mg | ORAL_TABLET | Freq: Four times a day (QID) | ORAL | Status: DC
  Administered 2014-12-07 – 2014-12-09 (×9): 600 mg via ORAL
  Filled 2014-12-07 (×9): qty 1

## 2014-12-07 MED ORDER — DIBUCAINE 1 % RE OINT: 1.0000 "application " | TOPICAL_OINTMENT | RECTAL | Status: DC | PRN

## 2014-12-07 MED ORDER — LACTATED RINGERS IV SOLN
INTRAVENOUS | Status: DC
  Administered 2014-12-07 – 2014-12-08 (×3): via INTRAVENOUS

## 2014-12-07 MED ORDER — LABETALOL HCL 5 MG/ML IV SOLN
20.0000 mg | INTRAVENOUS | Status: DC | PRN
Start: 2014-12-07 — End: 2014-12-09

## 2014-12-07 MED ORDER — SIMETHICONE 80 MG PO CHEW
80.0000 mg | CHEWABLE_TABLET | ORAL | Status: DC | PRN
  Administered 2014-12-08: 80 mg via ORAL
  Filled 2014-12-07: qty 1

## 2014-12-07 MED ORDER — OXYCODONE-ACETAMINOPHEN 5-325 MG PO TABS: 2.0000 | ORAL_TABLET | ORAL | Status: DC | PRN

## 2014-12-07 MED ORDER — BENZOCAINE-MENTHOL 20-0.5 % EX AERO
1.0000 "application " | INHALATION_SPRAY | CUTANEOUS | Status: DC | PRN
  Administered 2014-12-09: 1 via TOPICAL
  Filled 2014-12-07 (×2): qty 56

## 2014-12-07 MED ORDER — HYDRALAZINE HCL 20 MG/ML IJ SOLN: 10.0000 mg | Freq: Once | INTRAMUSCULAR | Status: DC | PRN

## 2014-12-07 MED ORDER — ZOLPIDEM TARTRATE 5 MG PO TABS: 5.0000 mg | ORAL_TABLET | Freq: Every evening | ORAL | Status: DC | PRN

## 2014-12-07 MED ORDER — ONDANSETRON HCL 4 MG/2ML IJ SOLN: 4.0000 mg | INTRAMUSCULAR | Status: DC | PRN

## 2014-12-07 MED ORDER — LABETALOL HCL 5 MG/ML IV SOLN: 20.0000 mg | INTRAVENOUS | Status: DC | PRN

## 2014-12-07 MED ORDER — HYDRALAZINE HCL 20 MG/ML IJ SOLN: 10.0000 mg | Freq: Once | INTRAMUSCULAR | Status: AC | PRN

## 2014-12-07 NOTE — Progress Notes (Addendum)
Called to room due to lates w/ pushing. Pushing assessed -- late decels in fact noted w/ pushing. Pitocin off, position change, IV bolus. Explained concerning tracing and cessation of pushing until attending present. Dr. Richardson Dopp notified and enroute to assess/determine VAVD or c-section.   Sherre Scarlet, CNM 12/07/14, 05:46 AM

## 2014-12-07 NOTE — Progress Notes (Addendum)
  Subjective: Comfortable w/ epidural. Family at bedside.  Objective: BP 143/74 T 98.6 P 76 R 16  FHT: BL 120 w/ min variability, +scalp stim, isolated late, intermittent variables to 100 bpm UC:   irregular, every 3-5 minutes SVE: 7-8/90/-2 per RN   Pitocin at 6 mU/min  Assessment:  IUP at term GBS neg Progressive labor SROM x 7.5 hrs; no concerns for infection Cat 2 FHRT  Plan: Continue plan + intrauterine resuscitative measures Consult prn  Sherre Scarlet CNM 12/07/2014, 03:00 AM

## 2014-12-07 NOTE — Progress Notes (Signed)
Addendum Per Dr Simonne Come Vitals:   12/07/14 0645 12/07/14 0647 12/07/14 0701 12/07/14 0716  BP: 150/87 145/85 169/96 165/76  Pulse: 87 88  95  Temp:      TempSrc:      Resp:   20   Height:      Weight:      SpO2:       Start Mag 4g bolus w/2g maintainence  Repeat PIH labs at 1500 today

## 2014-12-07 NOTE — Progress Notes (Signed)
  Subjective: Denies h/a, visual disturbances, RUQ pain, CP, SOB, weakness or severe N/V. States tired, increased pressure. FOB and friends at bedside.  Objective: BP 152/85 mmHg  Pulse 65  Temp(Src) 98.6 F (37 C) (Oral)  Resp 16  Ht 5\' 5"  (1.651 m)  Wt 109.77 kg (242 lb)  BMI 40.27 kg/m2  SpO2 98%  LMP 02/28/2014     Today's Vitals   12/07/14 0331 12/07/14 0401 12/07/14 0431 12/07/14 0501  BP: 147/85 129/102 151/82 152/85  Pulse: 77 78 81 65  Temp:      TempSrc:      Resp:      Height:      Weight:      SpO2:      PainSc:       FHT: BL 130 w/ mod variability, +accels w/ scalp stim, intermittent variables UC:   irregular, every 1-3 minutes SVE:   Dilation: 10 Effacement (%): 100 Station: +2 Exam by:: L.Mears,RN Pitocin at 6 mU/min FSE placed by RN at 04:14 AM  Assessment:  Gestational Hypertension 2nd stage labor GBS neg Cat 2 FHRT  Plan: Preeclampsia labs + urine PCR Continue intrauterine resuscitative measures Commence w/ pushing   Sherre Scarlet CNM 12/07/2014, 5:23 AM

## 2014-12-07 NOTE — Progress Notes (Signed)
  Subjective: Comfortable w/ epidural.  Objective:  BP 127/59 T 98.4  P 79    FHT: BL 115 w/ moderate variability, +accels, no decels UC:   irregular, every 3-5 minutes SVE: Deferred   Pitocin at 2 mU/min  Assessment:  IUP at term GBS neg SROM x 4.5 hrs; no concerns for infection  Plan: Continue plan Consult prn  Sherre Scarlet CNM 12/07/2014, 5:29 AM

## 2014-12-07 NOTE — Lactation Note (Signed)
This note was copied from the chart of Julie Rakyia Mullenix. Lactation Consultation Note  Baby is 10 hours of life.  Assisted mom with latching [name].  He sucks his lips and tongue and his intraoral tension is high.  He does not open his mouth widely at this point so I encouraged laid back BF when mom feels better.  I had him suck on a gloved finger and was able to advance it to the posterior portion of his mouth.  He relaxed his mouth some when the TMJ was massage.  Mom is becoming more comfortable with latching him.  A frenum was noted near the tip of his tongue that causes tongue retraction with extention    Patient Name: Julie Finley Today's Date: 12/07/2014 Reason for consult: Initial assessment   Maternal Data Has patient been taught Hand Expression?: Yes Does the patient have breastfeeding experience prior to this delivery?: No  Feeding Feeding Type: Breast Fed Length of feed: 10 min  LATCH Score/Interventions Latch: Repeated attempts needed to sustain latch, nipple held in mouth throughout feeding, stimulation needed to elicit sucking reflex. Intervention(s): Adjust position;Assist with latch;Breast massage;Breast compression  Audible Swallowing: A few with stimulation Intervention(s): Skin to skin;Hand expression  Type of Nipple: Everted at rest and after stimulation  Comfort (Breast/Nipple): Soft / non-tender     Hold (Positioning): Assistance needed to correctly position infant at breast and maintain latch. Intervention(s): Breastfeeding basics reviewed;Support Pillows;Skin to skin  LATCH Score: 7  Lactation Tools Discussed/Used     Consult Status      Soyla Dryer 12/07/2014, 4:25 PM

## 2014-12-08 LAB — MAGNESIUM: Magnesium: 4 mg/dL — ABNORMAL HIGH (ref 1.7–2.4)

## 2014-12-08 LAB — CBC
HEMATOCRIT: 31.5 % — AB (ref 36.0–46.0)
Hemoglobin: 10.3 g/dL — ABNORMAL LOW (ref 12.0–15.0)
MCH: 25.4 pg — AB (ref 26.0–34.0)
MCHC: 32.7 g/dL (ref 30.0–36.0)
MCV: 77.6 fL — ABNORMAL LOW (ref 78.0–100.0)
Platelets: 262 10*3/uL (ref 150–400)
RBC: 4.06 MIL/uL (ref 3.87–5.11)
RDW: 16.3 % — ABNORMAL HIGH (ref 11.5–15.5)
WBC: 12.1 10*3/uL — AB (ref 4.0–10.5)

## 2014-12-08 LAB — COMPREHENSIVE METABOLIC PANEL
ALT: 13 U/L — ABNORMAL LOW (ref 14–54)
ANION GAP: 6 (ref 5–15)
AST: 19 U/L (ref 15–41)
Albumin: 2.3 g/dL — ABNORMAL LOW (ref 3.5–5.0)
Alkaline Phosphatase: 141 U/L — ABNORMAL HIGH (ref 38–126)
BILIRUBIN TOTAL: 0.1 mg/dL — AB (ref 0.3–1.2)
BUN: 6 mg/dL (ref 6–20)
CALCIUM: 7.7 mg/dL — AB (ref 8.9–10.3)
CO2: 21 mmol/L — AB (ref 22–32)
Chloride: 111 mmol/L (ref 101–111)
Creatinine, Ser: 0.65 mg/dL (ref 0.44–1.00)
GFR calc non Af Amer: >60 mL/min (ref >60)
Glucose, Bld: 75 mg/dL (ref 65–99)
Potassium: 3.4 mmol/L — ABNORMAL LOW (ref 3.5–5.1)
SODIUM: 138 mmol/L (ref 135–145)
TOTAL PROTEIN: 5.2 g/dL — AB (ref 6.5–8.1)

## 2014-12-08 LAB — LACTATE DEHYDROGENASE: LDH: 235 U/L — ABNORMAL HIGH (ref 98–192)

## 2014-12-08 MED ORDER — LORATADINE 10 MG PO TABS
10.0000 mg | ORAL_TABLET | Freq: Every day | ORAL | Status: DC
  Administered 2014-12-08 – 2014-12-09 (×2): 10 mg via ORAL
  Filled 2014-12-08 (×2): qty 1

## 2014-12-08 NOTE — Progress Notes (Signed)
Subjective: Postpartum Day 1: Vaginal delivery, left periurethral laceration Patient up ad lib, reports no syncope or dizziness. Denies HA, visual sx, or epigastric pain. Feeding:  Breast Contraceptive plan:  Undecided  Baby with hypospadius  Objective: Vital signs in last 24 hours: Temp:  [97.7 F (36.5 C)-98.5 F (36.9 C)] 97.7 F (36.5 C) (06/12 0845) Pulse Rate:  [74-105] 79 (06/12 0845) Resp:  [16-20] 16 (06/12 0845) BP: (110-148)/(49-82) 125/65 mmHg (06/12 0845) SpO2:  [94 %-100 %] 96 % (06/12 0845)\  Filed Vitals:   12/08/14 0400 12/08/14 0500 12/08/14 0600 12/08/14 0845  BP: 111/49 110/65  125/65  Pulse: 79 79 87 79  Temp:  98.5 F (36.9 C)  97.7 F (36.5 C)  TempSrc:  Oral  Oral  Resp: 18  20 16   Height:      Weight:      SpO2: 96% 96% 97% 96%   Magnesium sulfate on since 0928 yesterday.  I/O balance last 24 hours:  +95.7 Urine output last shift;  1500 cc  Physical Exam:  General: alert Lochia: appropriate Uterine Fundus: firm Perineum: healing well DVT Evaluation: No evidence of DVT seen on physical exam. Negative Homan's sign. DTR 1+, no clonus, 1+ edema.  CBC Latest Ref Rng 12/08/2014 12/07/2014 12/07/2014  WBC 4.0 - 10.5 K/uL 12.1(H) 12.2(H) 9.9  Hemoglobin 12.0 - 15.0 g/dL 10.3(L) 11.0(L) 12.5  Hematocrit 36.0 - 46.0 % 31.5(L) 33.1(L) 36.2  Platelets 150 - 400 K/uL 262 271 295   CMP Latest Ref Rng 12/08/2014 12/07/2014 12/07/2014  Glucose 65 - 99 mg/dL 75 99 95  BUN 6 - 20 mg/dL 6 6 6   Creatinine 0.44 - 1.00 mg/dL 9.03 8.33 3.83  Sodium 135 - 145 mmol/L 138 137 135  Potassium 3.5 - 5.1 mmol/L 3.4(L) 3.6 3.6  Chloride 101 - 111 mmol/L 111 112(H) 109  CO2 22 - 32 mmol/L 21(L) 20(L) 20(L)  Calcium 8.9 - 10.3 mg/dL 7.7(L) 8.7(L) 8.9  Total Protein 6.5 - 8.1 g/dL 5.2(L) 5.4(L) 6.1(L)  Total Bilirubin 0.3 - 1.2 mg/dL 2.9(V) <0.1(L) 0.2(L)  Alkaline Phos 38 - 126 U/L 141(H) 160(H) 184(H)  AST 15 - 41 U/L 19 20 18   ALT 14 - 54 U/L 13(L) 13(L) 14    PCR 12/07/14 @ 1740 = 0.92  Assessment/Plan: Status post vaginal delivery day 1 Pre-eclampsia--currently normotensive Stable D/C magnesium at 0928 Monitor in AICU x 4 hours, then transfer to [facility] if stable. Continue current care. Plan for discharge tomorrow    Nyra Capes 12/08/2014, 9:20 AM

## 2014-12-08 NOTE — Lactation Note (Signed)
This note was copied from the chart of Julie Finley. Lactation Consultation Note  Patient Name: Julie Trudy-Ann Mahn JOACZ'Y Date: 12/08/2014 Reason for consult: Follow-up assessment  With this mom of a term baby, now 30 hours old. Mom has been feeding in football hold, and has been having difficulty latching on right side. I assisted mom with latching baby in football on lsemom, she said she noticed he does keep his head to the right.  I told mom he may have a "stiff neck" from either constriction in utero, or some other cause I was not able to see the baby off the breast due to time constraints, but I suspect he may have a torticollis. This would explain why mom had trouble latching him in football hold on the right side. I encouraged mom to do lots of tummy time and baby wrapping, and to gently  coax his head to the left this way, . Mom very receptive to teaching, and knows to call for questions/concerns. . I suggested she try cross cradle hold, since this, to the baby, would be very much football on the left.   Maternal Data    Feeding Feeding Type: Breast Fed Length of feed: 23 min  LATCH Score/Interventions Latch: Grasps breast easily, tongue down, lips flanged, rhythmical sucking. Intervention(s): Adjust position;Assist with latch;Breast compression  Audible Swallowing: Spontaneous and intermittent Intervention(s): Hand expression (steady stream of colostrum)  Type of Nipple: Everted at rest and after stimulation  Comfort (Breast/Nipple): Soft / non-tender     Hold (Positioning): Assistance needed to correctly position infant at breast and maintain latch. Intervention(s): Breastfeeding basics reviewed;Support Pillows;Position options;Skin to skin  LATCH Score: 9  Lactation Tools Discussed/Used     Consult Status Consult Status: Follow-up Date: 12/09/14 Follow-up type: In-patient    Alfred Levins 12/08/2014, 4:46 PM

## 2014-12-09 MED ORDER — IBUPROFEN 600 MG PO TABS: 600.0000 mg | ORAL_TABLET | Freq: Four times a day (QID) | ORAL | Status: DC | PRN

## 2014-12-09 NOTE — Progress Notes (Signed)
UR chart review completed.  

## 2014-12-09 NOTE — Discharge Instructions (Signed)
Postpartum Depression and Baby Blues °The postpartum period begins right after the birth of a baby. During this time, there is often a great amount of joy and excitement. It is also a time of many changes in the life of the parents. Regardless of how many times a mother gives birth, each child brings new challenges and dynamics to the family. It is not unusual to have feelings of excitement along with confusing shifts in moods, emotions, and thoughts. All mothers are at risk of developing postpartum depression or the "baby blues." These mood changes can occur right after giving birth, or they may occur many months after giving birth. The baby blues or postpartum depression can be mild or severe. Additionally, postpartum depression can go away rather quickly, or it can be a long-term condition.  °CAUSES °Raised hormone levels and the rapid drop in those levels are thought to be a main cause of postpartum depression and the baby blues. A number of hormones change during and after pregnancy. Estrogen and progesterone usually decrease right after the delivery of your baby. The levels of thyroid hormone and various cortisol steroids also rapidly drop. Other factors that play a role in these mood changes include major life events and genetics.  °RISK FACTORS °If you have any of the following risks for the baby blues or postpartum depression, know what symptoms to watch out for during the postpartum period. Risk factors that may increase the likelihood of getting the baby blues or postpartum depression include: °· Having a personal or family history of depression.   °· Having depression while being pregnant.   °· Having premenstrual mood issues or mood issues related to oral contraceptives. °· Having a lot of life stress.   °· Having marital conflict.   °· Lacking a social support network.   °· Having a baby with special needs.   °· Having health problems, such as diabetes.   °SIGNS AND SYMPTOMS °Symptoms of baby blues  include: °· Brief changes in mood, such as going from extreme happiness to sadness. °· Decreased concentration.   °· Difficulty sleeping.   °· Crying spells, tearfulness.   °· Irritability.   °· Anxiety.   °Symptoms of postpartum depression typically begin within the first month after giving birth. These symptoms include: °· Difficulty sleeping or excessive sleepiness.   °· Marked weight loss.   °· Agitation.   °· Feelings of worthlessness.   °· Lack of interest in activity or food.   °Postpartum psychosis is a very serious condition and can be dangerous. Fortunately, it is rare. Displaying any of the following symptoms is cause for immediate medical attention. Symptoms of postpartum psychosis include:  °· Hallucinations and delusions.   °· Bizarre or disorganized behavior.   °· Confusion or disorientation.   °DIAGNOSIS  °A diagnosis is made by an evaluation of your symptoms. There are no medical or lab tests that lead to a diagnosis, but there are various questionnaires that a health care provider may use to identify those with the baby blues, postpartum depression, or psychosis. Often, a screening tool called the Edinburgh Postnatal Depression Scale is used to diagnose depression in the postpartum period.  °TREATMENT °The baby blues usually goes away on its own in 1-2 weeks. Social support is often all that is needed. You will be encouraged to get adequate sleep and rest. Occasionally, you may be given medicines to help you sleep.  °Postpartum depression requires treatment because it can last several months or longer if it is not treated. Treatment may include individual or group therapy, medicine, or both to address any social, physiological, and psychological   factors that may play a role in the depression. Regular exercise, a healthy diet, rest, and social support may also be strongly recommended.  Postpartum psychosis is more serious and needs treatment right away. Hospitalization is often needed. HOME CARE  INSTRUCTIONS  Get as much rest as you can. Nap when the baby sleeps.   Exercise regularly. Some women find yoga and walking to be beneficial.   Eat a balanced and nourishing diet.   Do little things that you enjoy. Have a cup of tea, take a bubble bath, read your favorite magazine, or listen to your favorite music.  Avoid alcohol.   Ask for help with household chores, cooking, grocery shopping, or running errands as needed. Do not try to do everything.   Talk to people close to you about how you are feeling. Get support from your partner, family members, friends, or other new moms.  Try to stay positive in how you think. Think about the things you are grateful for.   Do not spend a lot of time alone.   Only take over-the-counter or prescription medicine as directed by your health care provider.  Keep all your postpartum appointments.   Let your health care provider know if you have any concerns.  SEEK MEDICAL CARE IF: You are having a reaction to or problems with your medicine. SEEK IMMEDIATE MEDICAL CARE IF:  You have suicidal feelings.   You think you may harm the baby or someone else. MAKE SURE YOU:  Understand these instructions.  Will watch your condition.  Will get help right away if you are not doing well or get worse. Document Released: 03/18/2004 Document Revised: 06/19/2013 Document Reviewed: 03/26/2013 [facility] Patient Information 2015 [place], [place]. This information is not intended to replace advice given to you by your health care provider. Make sure you discuss any questions you have with your health care provider. Postpartum Care After Vaginal Delivery After you deliver your newborn (postpartum period), the usual stay in the hospital is 24-72 hours. If there were problems with your labor or delivery, or if you have other medical problems, you might be in the hospital longer.  While you are in the hospital, you will receive help and instructions on  how to care for yourself and your newborn during the postpartum period.  While you are in the hospital:  Be sure to tell your nurses if you have pain or discomfort, as well as where you feel the pain and what makes the pain worse.  If you had an incision made near your vagina (episiotomy) or if you had some tearing during delivery, the nurses may put ice packs on your episiotomy or tear. The ice packs may help to reduce the pain and swelling.  If you are breastfeeding, you may feel uncomfortable contractions of your uterus for a couple of weeks. This is normal. The contractions help your uterus get back to normal size.  It is normal to have some bleeding after delivery.  For the first 1-3 days after delivery, the flow is red and the amount may be similar to a period.  It is common for the flow to start and stop.  In the first few days, you may pass some small clots. Let your nurses know if you begin to pass large clots or your flow increases.  Do not  flush blood clots down the toilet before having the nurse look at them.  During the next 3-10 days after delivery, your flow should become more watery  and pink or brown-tinged in color.  Ten to fourteen days after delivery, your flow should be a small amount of yellowish-white discharge.  The amount of your flow will decrease over the first few weeks after delivery. Your flow may stop in 6-8 weeks. Most women have had their flow stop by 12 weeks after delivery.  You should change your sanitary pads frequently.  Wash your hands thoroughly with soap and water for at least 20 seconds after changing pads, using the toilet, or before holding or feeding your newborn.  You should feel like you need to empty your bladder within the first 6-8 hours after delivery.  In case you become weak, lightheaded, or faint, call your nurse before you get out of bed for the first time and before you take a shower for the first time.  Within the first few  days after delivery, your breasts may begin to feel tender and full. This is called engorgement. Breast tenderness usually goes away within 48-72 hours after engorgement occurs. You may also notice milk leaking from your breasts. If you are not breastfeeding, do not stimulate your breasts. Breast stimulation can make your breasts produce more milk.  Spending as much time as possible with your newborn is very important. During this time, you and your newborn can feel close and get to know each other. Having your newborn stay in your room (rooming in) will help to strengthen the bond with your newborn. It will give you time to get to know your newborn and become comfortable caring for your newborn.  Your hormones change after delivery. Sometimes the hormone changes can temporarily cause you to feel sad or tearful. These feelings should not last more than a few days. If these feelings last longer than that, you should talk to your caregiver.  If desired, talk to your caregiver about methods of family planning or contraception.  Talk to your caregiver about immunizations. Your caregiver may want you to have the following immunizations before leaving the hospital:  Tetanus, diphtheria, and pertussis (Tdap) or tetanus and diphtheria (Td) immunization. It is very important that you and your family (including grandparents) or others caring for your newborn are up-to-date with the Tdap or Td immunizations. The Tdap or Td immunization can help protect your newborn from getting ill.  Rubella immunization.  Varicella (chickenpox) immunization.  Influenza immunization. You should receive this annual immunization if you did not receive the immunization during your pregnancy. Document Released: 04/11/2007 Document Revised: 03/08/2012 Document Reviewed: 02/09/2012 [facility] Patient Information 2015 [place], [place]. This information is not intended to replace advice given to you by your health care provider. Make  sure you discuss any questions you have with your health care provider. Contraception Choices Birth control (contraception) is the use of any methods or devices to stop pregnancy from happening. Below are some methods to help avoid pregnancy. HORMONAL BIRTH CONTROL  A small tube put under the skin of the upper arm (implant). The tube can stay in place for 3 years. The implant must be taken out after 3 years.  Shots given every 3 months.  Pills taken every day.  Patches that are changed once a week.  A ring put into the vagina (vaginal ring). The ring is left in place for 3 weeks and removed for 1 week. Then, a new ring is put in the vagina.  Emergency birth control pills taken after unprotected sex (intercourse). BARRIER BIRTH CONTROL   A thin covering worn on the penis (female  condom) during sex.  A soft, loose covering put into the vagina (female condom) before sex.  A rubber bowl that sits over the cervix (diaphragm). The bowl must be made for you. The bowl is put into the vagina before sex. The bowl is left in place for 6 to 8 hours after sex.  A small, soft cup that fits over the cervix (cervical cap). The cup must be made for you. The cup can be left in place for 48 hours after sex.  A sponge that is put into the vagina before sex.  A chemical that kills or stops sperm from getting into the cervix and uterus (spermicide). The chemical may be a cream, jelly, foam, or pill. INTRAUTERINE (IUD) BIRTH CONTROL   IUD birth control is a small, T-shaped piece of plastic. The plastic is put inside the uterus. There are 2 types of IUD:  Copper IUD. The IUD is covered in copper wire. The copper makes a fluid that kills sperm. It can stay in place for 10 years.  Hormone IUD. The hormone stops pregnancy from happening. It can stay in place for 5 years. PERMANENT METHODS  When the woman has her fallopian tubes sealed, tied, or blocked during surgery. This stops the egg from traveling to  the uterus.  The doctor places a small coil or insert into each fallopian tube. This causes scar tissue to form and blocks the fallopian tubes.  When the female has the tubes that carry sperm tied off (vasectomy). NATURAL FAMILY PLANNING BIRTH CONTROL   Natural family planning means not having sex or using barrier birth control on the days the woman could become pregnant.  Use a calendar to keep track of the length of each period and know the days she can get pregnant.  Avoid sex during ovulation.  Use a thermometer to measure body temperature. Also watch for symptoms of ovulation.  Time sex to be after the woman has ovulated. Use condoms to help protect yourself against sexually transmitted infections (STIs). Do this no matter what type of birth control you use. Talk to your doctor about which type of birth control is best for you. Document Released: 04/11/2009 Document Revised: 06/19/2013 Document Reviewed: 01/03/2013 [facility] Patient Information 2015 [place], [place]. This information is not intended to replace advice given to you by your health care provider. Make sure you discuss any questions you have with your health care provider.

## 2014-12-09 NOTE — Discharge Summary (Signed)
  Vaginal Delivery Discharge Summary  Julie Finley  DOB:    Nov 21, 1995 MRN:    [id number] CSN:    [id number]  Date of admission:                  12/06/2014  Date of discharge:                   12/09/2014  Procedures this admission:   SVD with Left Periurethral Laceration  Date of Delivery: 12/07/2014  Newborn Data:  Live born female  Birth Weight: 6 lb 11.4 oz (3045 g) APGAR: 9, 9  Home with mother. Name: [facility] Circumcision Plan: Outpatient  History of Present Illness:  Ms. Julie Finley is a 19 y.o. female, G1P1001, who presents at [redacted]w[redacted]d weeks gestation. The patient has been followed at [facility] division of [facility]. She was admitted onset of labor and rupture of membranes. Her pregnancy has been complicated by:  Patient Active Problem List   Diagnosis Date Noted  . Periurethral laceration, delivered, current hospitalization 12/09/2014  . Vaginal delivery 12/07/2014  . Abnormal MSAFP (maternal serum alpha-fetoprotein), decreased   . Abnormal AFP3 test   . Abnormal fetal ultrasound      Hospital Course:  Admitted in latent labor with PROM. Negative GBS. Progressed  With augmentation. Utilized epidural for pain management.  Delivery was performed by K. Mayford Knife, CNM with Dr. Delrae Sawyers at bedside for potential operative delivery. Patient and baby tolerated the procedure without difficulty, with  Left periurethral laceration noted. Infant status was stable and remained in room with mother.  Mother has MgSO4 during postpartum course, but otherwise no complications. Breast feeding going well. Mom's physical exam was WNL, and she was discharged home in stable condition. Contraception plan was undecided and information sheets given.  She received adequate benefit from po pain medications.   Feeding:  breast  Contraception:  no method  Hemoglobin Results:  @CBC3   Discharge Physical Exam:   General: alert, cooperative  and no distress Chest: clear to auscultation, no wheezes, rales or rhonchi, symmetric air entry.  CVS exam: normal rate and regular rhythm. Breast exam: not examined. Abdomen:  + bowel sounds, Soft, NT Uterine Fundus: firm, At U/-3 Lochia: appropriate Laceration: Not assessed DVT Evaluation: No evidence of DVT seen on physical exam. Calf/Ankle edema is present. Exam of extremities: pedal edema Nonpitting  Intrapartum Procedures: spontaneous vaginal delivery Postpartum Procedures: MgSO4 Complications-Operative and Postpartum: periurethral laceration  Discharge Diagnoses: Term Pregnancy-delivered  Discharge Information:   Activity:           pelvic rest Diet:                routine Medications: Ibuprofen Condition:      stable Instructions:  Pain Management, Peri-Care, Breastfeeding, Who and When to call for postpartum complications. Information Sheet(s) given PPD, Contraception Choices, PP Care after Vaginal Delivery    Discharge to: home  Follow-up Information    Follow up with [facility]. Schedule an appointment as soon as possible for a visit in 6 weeks.   Specialty:  Obstetrics and Gynecology   Why:  Please call if you have any questions or concerns, prior to your next visit.   Contact information:   [address] [place] [address] [phone]    **Blood pressure check in one week via home health nurse.    Mardell Cragg LYNN CNM 12/09/2014 8:52 AM

## 2014-12-09 NOTE — Progress Notes (Signed)
Attended 'Well After Birth' group class which presented discharge care information for both Mom and Baby. 

## 2014-12-09 NOTE — Lactation Note (Signed)
This note was copied from the chart of Julie Finley. Lactation Consultation Note  Patient Name: Julie Finley IOMBT'D Date: 12/09/2014 Reason for consult: Follow-up assessment  Baby is at 6 % weight loss , Voiding and stooling adequate for age,  Latch score Range 7-9 . At 5 - hours old Bili check 9.8 . Per mom  Has apt with Pedis office tomorrow for a Bili check and weight check. Per mom latching has seemed easier over night and hearing more swallows. Baby hungry and showing feeding cues during [facility] consult.  Mom positions baby well on the left breast on the left breast in football position. LC noted the baby still positioning head to the right. Per mom the belly time has seemed to help stretch the baby  And assist with feedings. Baby was noted to be very calm with latch. LC assisted with depth. LC did noted the indentation in the mid section of the anterior portion of the tongue, baby able to stretch tongue over  Gum line , but doesn't raise above the corners of mouth. The frenulum isn't tight , but suspect it is short. Baby fed \ for 20 mins in an active swallowing pattern , increased with breast compressions. When the Baby released noted  The nipple to be well rounded. Mom denies sore nipples and no breakdown noted.  LC reviewed sore nipple and engorgement prevention and tx. LC stressed to mom when her milk comes in it is very important With the tongue ROM limitation to make sure she hand expresses enough milk off on the 1st breast so the areola is compressible like  A thin sandwich so the baby can obtain depth. LC instructed mom on the hand pump and provided a larger flange #27 for when the milk comes in. Mom and grand mother are receptive to coming back for a LC F/U apt on Friday June 17 th at 4 pm for reevaluation of Latch and frenulum .  LC recommended to mom to call [facility] for apt today .     Maternal Data Has patient been taught Hand Expression?: Yes  Feeding Feeding  Type: Breast Fed Length of feed: 20 min (multiply swallows, increased with breast compressions )  LATCH Score/Interventions Latch: Grasps breast easily, tongue down, lips flanged, rhythmical sucking. Intervention(s): Adjust position;Assist with latch;Breast massage;Breast compression  Audible Swallowing: Spontaneous and intermittent  Type of Nipple: Everted at rest and after stimulation  Comfort (Breast/Nipple): Soft / non-tender     Hold (Positioning): Assistance needed to correctly position infant at breast and maintain latch. Intervention(s): Breastfeeding basics reviewed;Support Pillows;Position options;Skin to skin  LATCH Score: 9  Lactation Tools Discussed/Used Tools: Pump;Flanges Flange Size: 27 Breast pump type: Manual WIC Program: No (encouraged to call today ) Pump Review: Setup, frequency, and cleaning;Milk Storage Initiated by:: MAI  Date initiated:: 12/09/14   Consult Status Consult Status: Follow-up Follow-up type: Out-patient    Kathrin Greathouse 12/09/2014, 11:19 AM

## 2014-12-11 NOTE — Anesthesia Postprocedure Evaluation (Signed)
  Anesthesia Post-op Note  Patient: Julie Finley  Procedure(s) Performed: * No procedures listed *  Patient Location: Mother/Baby  Anesthesia Type:Epidural  Level of Consciousness: awake  Airway and Oxygen Therapy: Patient Spontanous Breathing  Post-op Pain: mild  Post-op Assessment: Post-op Vital signs reviewed              Post-op Vital Signs: Reviewed and stable  Last Vitals: There were no vitals filed for this visit.  Complications: No apparent anesthesia complications

## 2014-12-13 ENCOUNTER — Ambulatory Visit (HOSPITAL_COMMUNITY)
Admit: 2014-12-13 | Discharge: 2014-12-13 | Disposition: A | Discharge status: Home / Self Care | Payer: Medicaid Other | Attending: Obstetrics and Gynecology | Admitting: Obstetrics and Gynecology

## 2014-12-13 NOTE — Lactation Note (Signed)
Lactation Consult  Mother's reason for visit:  Wanted help with breast feeding Visit Type:  Feeding assessment Appointment Notes:  ? Tight frenulum Consult:  Initial Lactation Consultant:  Audry Riles D  ________________________________________________________________________    ________________________________________________________________________  Mother's Name: Julie Finley Type of delivery:   Breastfeeding Experience:  P1 ________________________________________________________________________  Breastfeeding History (Post Discharge)  Frequency of breastfeeding:  q 2-3, does go 4 hours between feedings some nights Duration of feeding:  30-45   Patient does not supplement or pump.- has pumped twice because she was getting too full, manual pump from hospital  Infant Intake and Output Assessment  Voids:  4-5 in 24 hrs.  Color:  Clear yellow Stools:  7-8 in 24 hrs.  Color:  Yellow  ________________________________________________________________________  Maternal Breast Assessment  Breast:  Filling Nipple:  Erect  _______________________________________________________________________ Feeding Assessment/Evaluation  Initial feeding assessment:  Infant's oral assessment:  Variance- ?tight frenulum  Positioning:  Cradle Left breast  LATCH documentation:  Latch:  2 = Grasps breast easily, tongue down, lips flanged, rhythmical sucking.  Audible swallowing:  2 = Spontaneous and intermittent  Type of nipple:  2 = Everted at rest and after stimulation  Comfort (Breast/Nipple):  2 = Soft / non-tender  Hold (Positioning):  2 = No assistance needed to correctly position infant at breast  LATCH score:  10  Attached assessment:  Deep  Lips flanged:  Yes  Lips untucked:  No.  Suck assessment:  Nutritive   Pre-feed weight:  3074  g  (6 lb. 12.4 oz.) Post-feed weight:  3112  g (6  lb. 13.7 oz.) Amount transferred:  38 ml Amount supplemented:  0 ml   [name]  latched well after a couple of attempts and nursed for 20 min with lots of swallows noted. Mom reports that breast feels softer.   Additional Feeding Assessment -   Infant's oral assessment:  Variance  Positioning:  Cradle Right breast  LATCH documentation:  Latch:  2 = Grasps breast easily, tongue down, lips flanged, rhythmical sucking.  Audible swallowing:  2 = Spontaneous and intermittent  Type of nipple:  2 = Everted at rest and after stimulation  Comfort (Breast/Nipple):  2 = Soft / non-tender  Hold (Positioning):  2 = No assistance needed to correctly position infant at breast  LATCH score:  10  Attached assessment:  Deep  Lips flanged:  Yes.    Lips untucked:  No  Suck assessment:  Nutritive   Pre-feed weight:  3112 g  (6  lb. 13.7 oz.) Post-feed weight:  3156 g (6 lb. 15.3 oz.) Amount transferred:  44 ml Amount supplemented:  0 ml   Total amount transferred:  80 ml Total supplement given:  0 ml  [name] latched well and nursed for 15 min then off to sleep. Mom reports no pain with latch and lots of swallows noted. Momreports breasts is feeling softer. Does not usually do both breasts at one feeding. Asking about pumping and going back to work and school. Questions answered. To call prn

## 2015-06-01 ENCOUNTER — Encounter (HOSPITAL_BASED_OUTPATIENT_CLINIC_OR_DEPARTMENT_OTHER): Payer: Self-pay | Admitting: Emergency Medicine

## 2015-06-01 ENCOUNTER — Emergency Department (HOSPITAL_BASED_OUTPATIENT_CLINIC_OR_DEPARTMENT_OTHER)
Admission: EM | Admit: 2015-06-01 | Discharge: 2015-06-01 | Disposition: A | Discharge status: Home / Self Care | Payer: BLUE CROSS/BLUE SHIELD | Attending: Emergency Medicine | Admitting: Emergency Medicine

## 2015-06-01 DIAGNOSIS — J029 Acute pharyngitis, unspecified: Secondary | ICD-10-CM | POA: Diagnosis not present

## 2015-06-01 DIAGNOSIS — N12 Tubulo-interstitial nephritis, not specified as acute or chronic: Secondary | ICD-10-CM | POA: Insufficient documentation

## 2015-06-01 DIAGNOSIS — R42 Dizziness and giddiness: Secondary | ICD-10-CM | POA: Diagnosis not present

## 2015-06-01 DIAGNOSIS — Z79899 Other long term (current) drug therapy: Secondary | ICD-10-CM | POA: Insufficient documentation

## 2015-06-01 DIAGNOSIS — R51 Headache: Secondary | ICD-10-CM | POA: Diagnosis not present

## 2015-06-01 DIAGNOSIS — Z3202 Encounter for pregnancy test, result negative: Secondary | ICD-10-CM | POA: Insufficient documentation

## 2015-06-01 DIAGNOSIS — M545 Low back pain: Secondary | ICD-10-CM | POA: Diagnosis present

## 2015-06-01 LAB — URINALYSIS, ROUTINE W REFLEX MICROSCOPIC
GLUCOSE, UA: NEGATIVE mg/dL
KETONES UR: 15 mg/dL — AB
NITRITE: POSITIVE — AB
Protein, ur: 100 mg/dL — AB
SPECIFIC GRAVITY, URINE: 1.035 — AB (ref 1.005–1.030)
pH: 5.5 (ref 5.0–8.0)

## 2015-06-01 LAB — RAPID STREP SCREEN (MED CTR MEBANE ONLY): Streptococcus, Group A Screen (Direct): NEGATIVE

## 2015-06-01 LAB — URINE MICROSCOPIC-ADD ON

## 2015-06-01 LAB — PREGNANCY, URINE: Preg Test, Ur: NEGATIVE

## 2015-06-01 MED ORDER — ONDANSETRON 4 MG PO TBDP: 4.0000 mg | ORAL_TABLET | Freq: Three times a day (TID) | ORAL | Status: DC | PRN

## 2015-06-01 MED ORDER — IBUPROFEN 800 MG PO TABS: 800.0000 mg | ORAL_TABLET | Freq: Three times a day (TID) | ORAL | Status: DC

## 2015-06-01 MED ORDER — CEPHALEXIN 500 MG PO CAPS: 500.0000 mg | ORAL_CAPSULE | Freq: Four times a day (QID) | ORAL | Status: DC

## 2015-06-01 MED ORDER — KETOROLAC TROMETHAMINE 60 MG/2ML IM SOLN
60.0000 mg | Freq: Once | INTRAMUSCULAR | Status: AC
  Administered 2015-06-01: 60 mg via INTRAMUSCULAR
  Filled 2015-06-01: qty 2

## 2015-06-01 MED ORDER — CEFTRIAXONE SODIUM 1 G IJ SOLR
1.0000 g | Freq: Once | INTRAMUSCULAR | Status: AC
  Administered 2015-06-01: 1 g via INTRAMUSCULAR
  Filled 2015-06-01: qty 10

## 2015-06-01 NOTE — ED Notes (Signed)
Dr. Hyacinth MeekerMiller into room, pt seen buy EDP prior to RN assessment, see MD notes, pending orders.

## 2015-06-01 NOTE — ED Notes (Signed)
Patient reports that she was dx with a yeast infection on Friday, but has not taken the medication yet because the Rx was never called in. The patient reports that she now have pain in her back and throat and ovaries.

## 2015-06-01 NOTE — ED Provider Notes (Signed)
CSN: [id number]     Arrival date & time 06/01/15  0022 History  By signing my name below, I, Budd PalmerVanessa Prueter, attest that this documentation has been prepared under the direction and in the presence of Eber HongBrian Nieves Chapa, MD. Electronically Signed: Budd PalmerVanessa Prueter, ED Scribe. 06/01/2015. 1:02 AM.    Chief Complaint  Patient presents with  . Back Pain   The history is provided by the patient. No language interpreter was used.   HPI Comments: Julie Finley is a 19 y.o. female who presents to the Emergency Department complaining of intermittent, aching, left-sided lower back pain onset 2 hours ago. She reports associated left-sided groin pain, HA, dizziness, sore throat, and dysuria. She notes she has tried using baking soda directly on the affected area. Pt states she was diagnosed at the health department with a yeast infection on 12/1, but has not been able to take the prescribed medication for it, as the scrips was never called in. She reports she also had a lesion on her vagina which she believes may be herpes. She notes they tested it, but she has not heard back about the results yet. She also reports a PMHx of frequent UTI's during pregnancy. Pt denies nausea and fever.   Past Medical History  Diagnosis Date  . Medical history non-contributory    Past Surgical History  Procedure Laterality Date  . No past surgeries     History reviewed. No pertinent family history. Social History  Substance Use Topics  . Smoking status: Never Smoker   . Smokeless tobacco: None  . Alcohol Use: No   OB History    Gravida Para Term Preterm AB TAB SAB Ectopic Multiple Living   1 1 1       0 1     Review of Systems  Constitutional: Negative for fever.  HENT: Positive for sore throat.   Gastrointestinal: Positive for abdominal pain. Negative for nausea.  Genitourinary: Positive for dysuria.  Musculoskeletal: Positive for back pain.  Neurological: Positive for dizziness and headaches.  All other systems  reviewed and are negative.   Allergies  Review of patient's allergies indicates no known allergies.  Home Medications   Prior to Admission medications   Medication Sig Start Date End Date Taking? Authorizing Provider  cephALEXin (KEFLEX) 500 MG capsule Take 1 capsule (500 mg total) by mouth 4 (four) times daily. 06/01/15   Eber HongBrian Shaterra Sanzone, MD  ibuprofen (ADVIL,MOTRIN) 800 MG tablet Take 1 tablet (800 mg total) by mouth 3 (three) times daily. 06/01/15   Eber HongBrian Dekisha Mesmer, MD  ondansetron (ZOFRAN ODT) 4 MG disintegrating tablet Take 1 tablet (4 mg total) by mouth every 8 (eight) hours as needed for nausea. 06/01/15   Eber HongBrian Zeyna Mkrtchyan, MD  Prenatal Vit-Fe Fumarate-FA (PRENATAL VITAMIN PO) Take 1 tablet by mouth daily.     Historical Provider, MD   BP 131/72 mmHg  Pulse 97  Temp(Src) 99.5 F (37.5 C) (Oral)  Resp 18  Ht 5\' 6"  (1.676 m)  Wt 240 lb (108.863 kg)  BMI 38.76 kg/m2  SpO2 99%  LMP 05/31/2015 Physical Exam  Constitutional: She appears well-developed and well-nourished.  HENT:  Head: Normocephalic and atraumatic.  Eyes: Conjunctivae are normal. Right eye exhibits no discharge. Left eye exhibits no discharge.  Pulmonary/Chest: Effort normal. No respiratory distress.  Musculoskeletal: She exhibits no tenderness.  No CVA TTP  Neurological: She is alert. Coordination normal.  Skin: Skin is warm and dry. No rash noted. She is not diaphoretic. No erythema.  Psychiatric: She  has a normal mood and affect.  Nursing note and vitals reviewed.   ED Course  Procedures  DIAGNOSTIC STUDIES: Oxygen Saturation is 99% on RA, normal by my interpretation.    COORDINATION OF CARE: 1:00 AM - Discussed plans to test for strep. Will order urinalysis and a shot of antibiotics. Pt advised of plan for treatment and pt agrees.  Labs Review Labs Reviewed  URINALYSIS, ROUTINE W REFLEX MICROSCOPIC (NOT AT [facility]) - Abnormal; Notable for the following:    Color, Urine ORANGE (*)    APPearance CLOUDY (*)     Specific Gravity, Urine 1.035 (*)    Hgb urine dipstick LARGE (*)    Bilirubin Urine MODERATE (*)    Ketones, ur 15 (*)    Protein, ur 100 (*)    Nitrite POSITIVE (*)    Leukocytes, UA MODERATE (*)    All other components within normal limits  URINE MICROSCOPIC-ADD ON - Abnormal; Notable for the following:    Squamous Epithelial / LPF 0-5 (*)    Bacteria, UA MANY (*)    All other components within normal limits  RAPID STREP SCREEN (NOT AT [facility])  CULTURE, GROUP A STREP  URINE CULTURE  PREGNANCY, URINE    Imaging Review No results found. I have personally reviewed and evaluated these images and lab results as part of my medical decision-making.    MDM   Final diagnoses:  Pyelonephritis    Pyelo based on labs and clinical symptoms of L flank and lower back pain as well as LLQ ttp.  VS normal - no fever or tachycardia and no vomiting  Rocephin IM Strep neg Stable for d/c - tolerating PO  I personally performed the services described in this documentation, which was scribed in my presence. The recorded information has been reviewed and is accurate.     Meds given in ED:  Medications  cefTRIAXone (ROCEPHIN) injection 1 g (not administered)  ketorolac (TORADOL) injection 60 mg (60 mg Intramuscular Given 06/01/15 0137)    New Prescriptions   CEPHALEXIN (KEFLEX) 500 MG CAPSULE    Take 1 capsule (500 mg total) by mouth 4 (four) times daily.   IBUPROFEN (ADVIL,MOTRIN) 800 MG TABLET    Take 1 tablet (800 mg total) by mouth 3 (three) times daily.   ONDANSETRON (ZOFRAN ODT) 4 MG DISINTEGRATING TABLET    Take 1 tablet (4 mg total) by mouth every 8 (eight) hours as needed for nausea.       Eber Hong, MD 06/01/15 [phone]

## 2015-06-01 NOTE — Discharge Instructions (Signed)
Pyelonephritis, Adult °Pyelonephritis is a kidney infection. The kidneys are the organs that filter a person's blood and move waste out of the bloodstream and into the urine. Urine passes from the kidneys, through the ureters, and into the bladder. There are two main types of pyelonephritis: °· Infections that come on quickly without any warning (acute pyelonephritis). °· Infections that last for a long period of time (chronic pyelonephritis). °In most cases, the infection clears up with treatment and does not cause further problems. More severe infections or chronic infections can sometimes spread to the bloodstream or lead to other problems with the kidneys. °CAUSES °This condition is usually caused by: °· Bacteria traveling from the bladder to the kidney through infected urine. The urine in the bladder can become infected with bacteria from: °¨ Bladder infection (cystitis). °¨ Inflammation of the prostate gland (prostatitis). °¨ Sexual intercourse, in females. °· Bacteria traveling from the bloodstream to the kidney. °RISK FACTORS °This condition is more likely to develop in: °· Pregnant women. °· Older people. °· People who have diabetes. °· People who have kidney stones or bladder stones. °· People who have other abnormalities of the kidney or ureter. °· People who have a catheter placed in the bladder. °· People who have cancer. °· People who are sexually active. °· Women who use spermicides. °· People who have had a prior urinary tract infection. °SYMPTOMS °Symptoms of this condition include: °· Frequent urination. °· Strong or persistent urge to urinate. °· Burning or stinging when urinating. °· Abdominal pain. °· Back pain. °· Pain in the side or flank area. °· Fever. °· Chills. °· Blood in the urine, or dark urine. °· Nausea. °· Vomiting. °DIAGNOSIS °This condition may be diagnosed based on: °· Medical history and physical exam. °· Urine tests. °· Blood tests. °You may also have imaging tests of the  kidneys, such as an ultrasound or CT scan. °TREATMENT °Treatment for this condition may depend on the severity of the infection. °· If the infection is mild and is found early, you may be treated with antibiotic medicines taken by mouth. You will need to drink fluids to remain hydrated. °· If the infection is more severe, you may need to stay in the hospital and receive antibiotics given directly into a vein through an IV tube. You may also need to receive fluids through an IV tube if you are not able to remain hydrated. After your hospital stay, you may need to take oral antibiotics for a period of time. °Other treatments may be required, depending on the cause of the infection. °HOME CARE INSTRUCTIONS °Medicines °· Take over-the-counter and prescription medicines only as told by your health care provider. °· If you were prescribed an antibiotic medicine, take it as told by your health care provider. Do not stop taking the antibiotic even if you start to feel better. °General Instructions °· Drink enough fluid to keep your urine clear or pale yellow. °· Avoid caffeine, tea, and carbonated beverages. They tend to irritate the bladder. °· Urinate often. Avoid holding in urine for long periods of time. °· Urinate before and after sex. °· After a bowel movement, women should cleanse from front to back. Use each tissue only once. °· Keep all follow-up visits as told by your health care provider. This is important. °SEEK MEDICAL CARE IF: °· Your symptoms do not get better after 2 days of treatment. °· Your symptoms get worse. °· You have a fever. °SEEK IMMEDIATE MEDICAL CARE IF: °· You   are unable to take your antibiotics or fluids.  You have shaking chills.  You vomit.  You have severe flank or back pain.  You have extreme weakness or fainting.   This information is not intended to replace advice given to you by your health care provider. Make sure you discuss any questions you have with your health care  provider.   Document Released: 06/14/2005 Document Revised: 03/05/2015 Document Reviewed: 10/07/2014 Elsevier Interactive Patient Education [employer]2016 [employer]  Please obtain all of your results from medical records or have your doctors office obtain the results - share them with your doctor - you should be seen at your doctors office in the next 2 days. Call today to arrange your follow up. Take the medications as prescribed. Please review all of the medicines and only take them if you do not have an allergy to them. Please be aware that if you are taking birth control pills, taking other prescriptions, ESPECIALLY ANTIBIOTICS may make the birth control ineffective - if this is the case, either do not engage in sexual activity or use alternative methods of birth control such as condoms until you have finished the medicine and your family doctor says it is OK to restart them. If you are on a blood thinner such as COUMADIN, be aware that any other medicine that you take may cause the coumadin to either work too much, or not enough - you should have your coumadin level rechecked in next 7 days if this is the case.  ?  It is also a possibility that you have an allergic reaction to any of the medicines that you have been prescribed - Everybody reacts differently to medications and while MOST people have no trouble with most medicines, you may have a reaction such as nausea, vomiting, rash, swelling, shortness of breath. If this is the case, please stop taking the medicine immediately and contact your physician.  ?  You should return to the ER if you develop severe or worsening symptoms.

## 2015-06-01 NOTE — ED Notes (Signed)
Pt ambulatory to room with steady gait

## 2015-06-02 LAB — URINE CULTURE

## 2015-06-03 LAB — CULTURE, GROUP A STREP

## 2015-10-29 IMAGING — US US OB FOLLOW-UP
1 series · 12 of 28 positions shown · non-contrast
Comparison: none

[Series 1: us ob follow-up · 0.23mm/px · 12 of 43 slices shown]
[im 2/43]
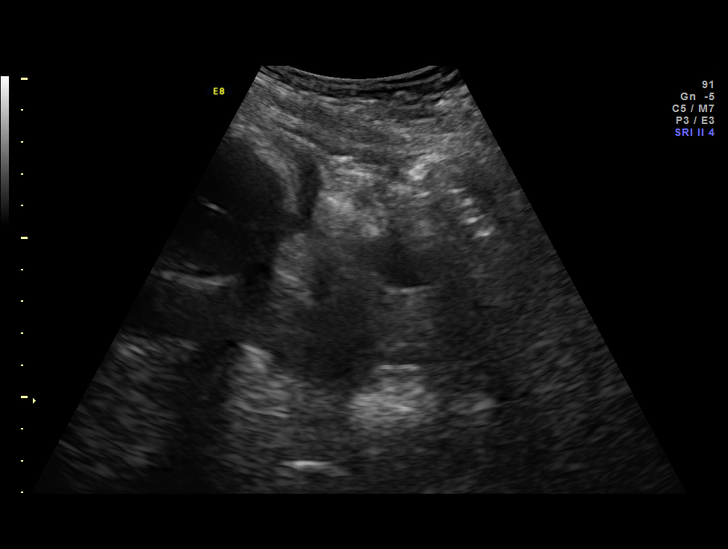
[im 5/43]
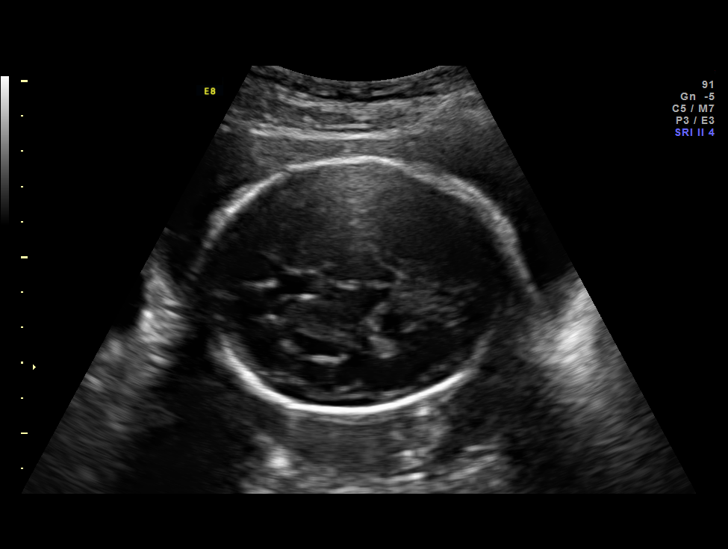
[im 8/43]
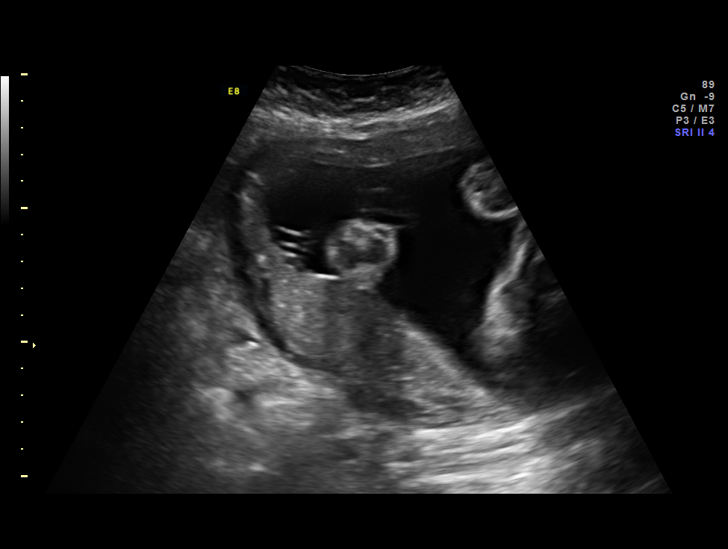
[im 13/43]
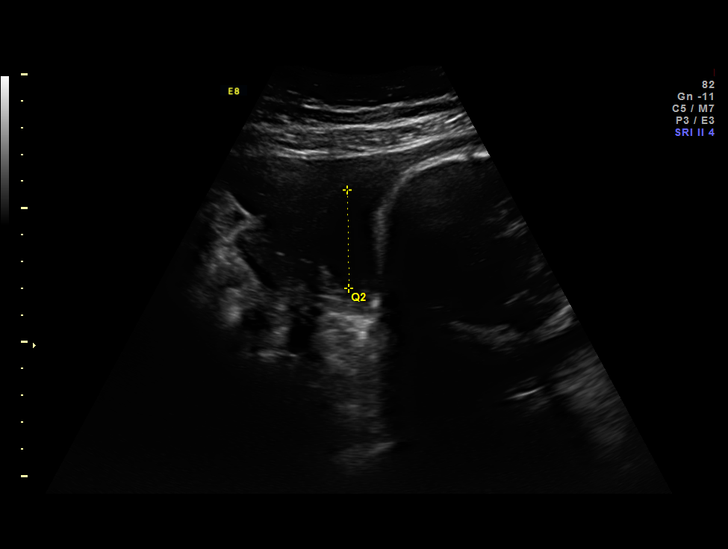
[im 16/43]
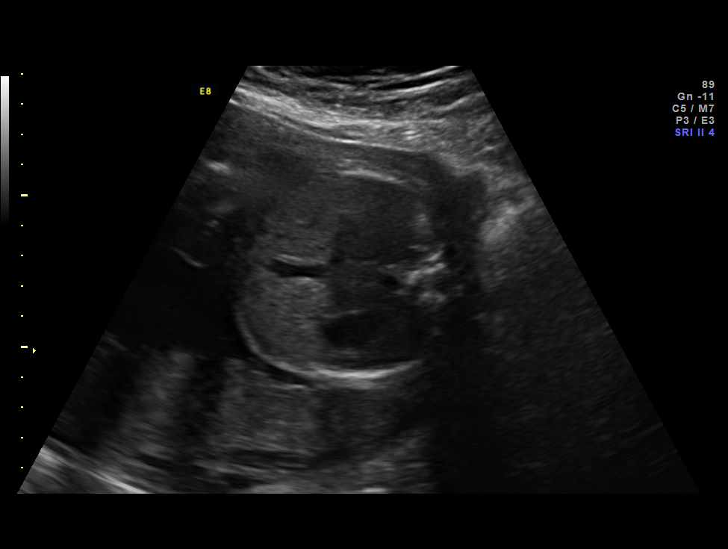
[im 19/43]
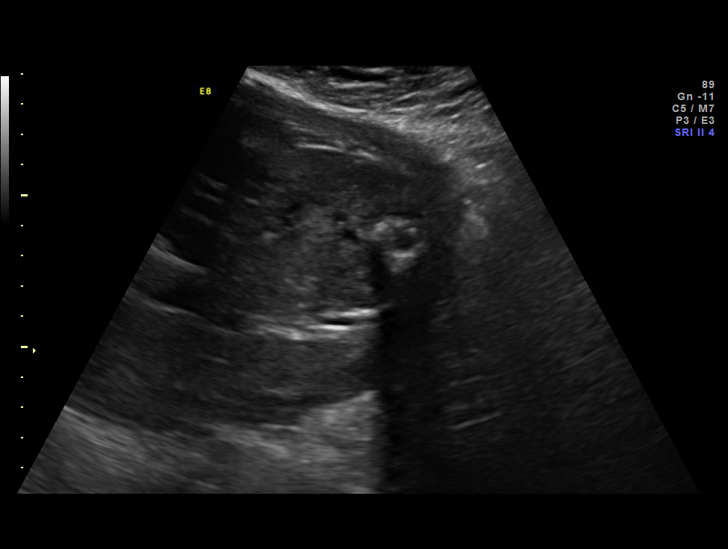
[im 24/43]
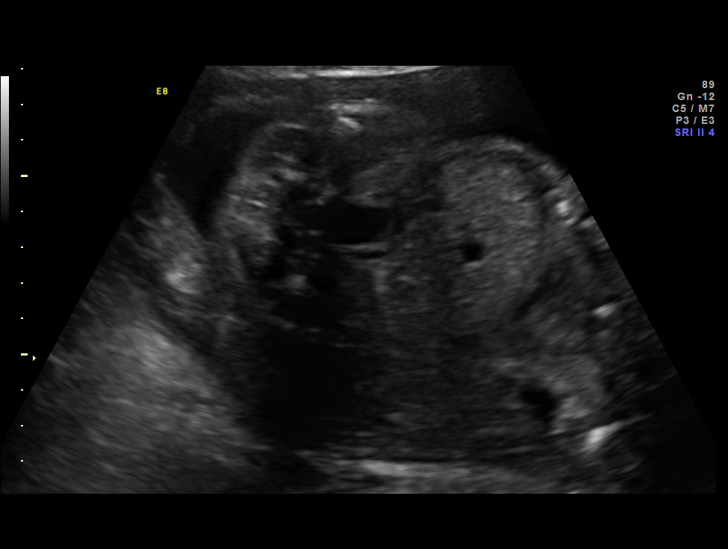
[im 27/43]
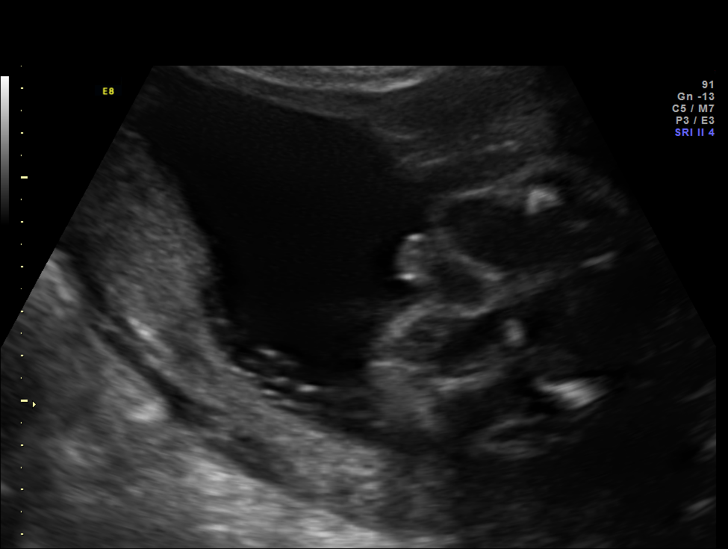
[im 30/43]
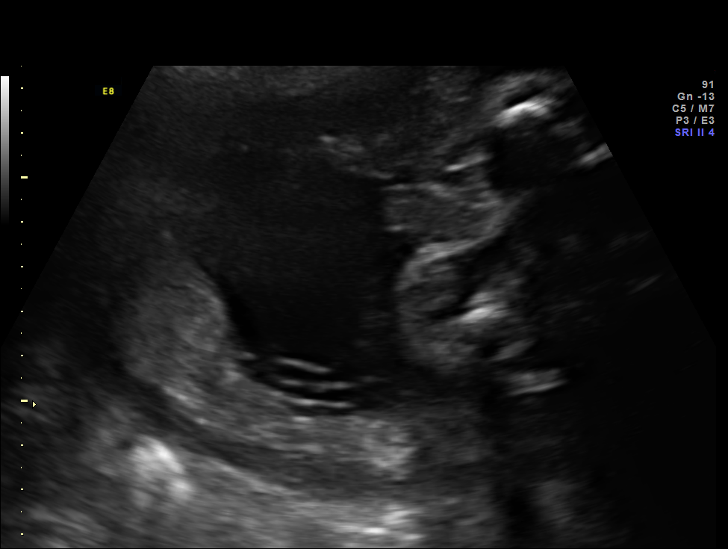
[im 35/43]
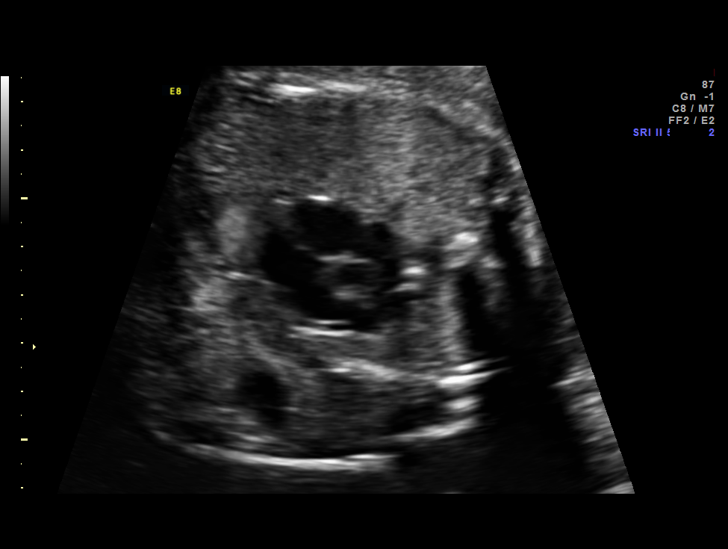
[im 38/43]
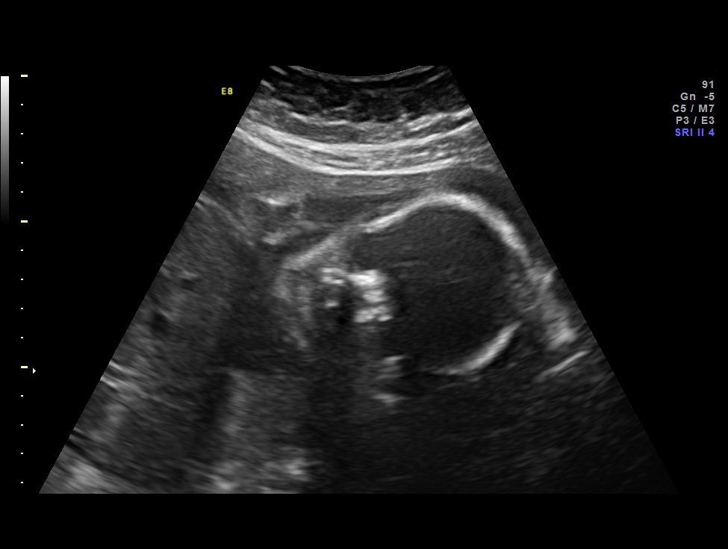
[im 41/43]
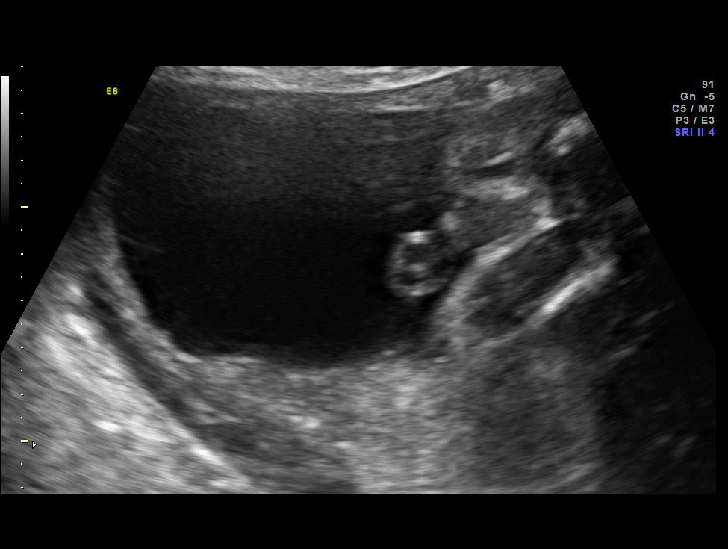

[12 of 28 positions shown; findings below may reference images not displayed]

OBSTETRICS REPORT
                      (Signed Final 09/12/2014 [DATE])

Service(s) Provided

 US OB FOLLOW UP                                       76816.1
Indications

 Abnormal biochemical screen (quad) for Trisomy
 21
 Obesity complicating pregnancy, second trimester
 Fetal abnormality - other known or suspected
 (ambiguous genitalia)-low risk male NIPS
 28 weeks gestation of pregnancy
Fetal Evaluation

 Num Of Fetuses:    1
 Fetal Heart Rate:  131                          bpm
 Cardiac Activity:  Observed
 Presentation:      Cephalic
 Placenta:          Posterior, above cervical
                    os
 P. Cord            Previously Visualized
 Insertion:

 Amniotic Fluid
 AFI FV:      Subjectively within normal limits
 AFI Sum:     16.19   cm       59  %Tile     Larg Pckt:    4.93  cm
 RUQ:   4.93    cm   RLQ:    3.85   cm    LUQ:   3.68    cm   LLQ:    3.73   cm
Biometry

 BPD:     71.4  mm     G. Age:  28w 5d                CI:         73.9   70 - 86
 OFD:     96.6  mm                                    FL/HC:      20.4   18.8 -

 HC:     269.8  mm     G. Age:  29w 3d       64  %    HC/AC:      1.07   1.05 -

 AC:     251.2  mm     G. Age:  29w 2d       80  %    FL/BPD:     77.0   71 - 87
 FL:        55  mm     G. Age:  29w 0d       65  %    FL/AC:      21.9   20 - 24
 HUM:     55.2  mm     G. Age:  32w 1d     > 95  %

 Est. FW:    6321  gm           3 lb     75  %
Gestational Age

 LMP:           28w 0d        Date:  02/28/14                 EDD:   12/05/14
 U/S Today:     29w 1d                                        EDD:   11/27/14
 Best:          28w 0d     Det. By:  LMP  (02/28/14)          EDD:   12/05/14
Anatomy

 Cranium:          Appears normal         Aortic Arch:      Previously seen
 Fetal Cavum:      Appears normal         Ductal Arch:      Previously seen
 Ventricles:       Appears normal         Diaphragm:        Appears normal
 Choroid Plexus:   Previously seen        Stomach:          Appears normal, left
                                                            sided
 Cerebellum:       Previously seen        Abdomen:          Previously seen
 Posterior Fossa:  Previously seen        Abdominal Wall:   Previously seen
 Nuchal Fold:      Not applicable (>20    Cord Vessels:     Previously seen
                   wks GA)
 Face:             Orbits and profile     Kidneys:          Appear normal
                   previously seen
 Lips:             Previously seen        Bladder:          Appears normal
 Palate:           Previously seen        Spine:            Previously seen
 Heart:            Appears normal         Lower             Previously seen
                   (4CH, axis, and        Extremities:
                   situs)
 RVOT:             Appears normal         Upper             Previously seen
                                          Extremities:
 LVOT:             Previously seen

 Other:  Heels previously seen. Ambiguous genitalia
Cervix Uterus Adnexa

 Cervical Length:    3.7      cm

 Cervix:       Normal appearance by transabdominal scan. Appears
               closed, without funnelling.
Impression

 Single IUP at 28w 0d
 Elevated DSR by quad screen (Trisomy 21 risk [DATE])  Low
 uE3 (0.16 MoM), ambiguous genetalia - cell free fetal DNA
 low risk for aneuploidy, male fetus
 The tip of the penis appears bulbous- suspicious for
 hypospadias
 A normal bladder was identified.  The remainder of the
 anatomy appears normal.
 Fetal growth is appropriate (75th %tile)
 Normal amniotic fluid volume
Recommendations

 Recommend follow-up ultrasound examination in 4 weeks for
 growth and to reevaluate.
 Will make arrangements for Peds Urology consultation

 questions or concerns.

## 2016-03-29 LAB — OB RESULTS CONSOLE HIV ANTIBODY (ROUTINE TESTING): HIV: NONREACTIVE

## 2016-03-29 LAB — HEMOGLOBIN EVAL RFX ELECTROPHORESIS
Cystic Fibrosis Profile: NEGATIVE
Hemoglobin Evaluation: NORMAL

## 2016-03-29 LAB — OB RESULTS CONSOLE VARICELLA ZOSTER ANTIBODY, IGG: Varicella: IMMUNE

## 2016-03-29 LAB — OB RESULTS CONSOLE RPR: RPR: NONREACTIVE

## 2016-06-28 NOTE — L&D Delivery Note (Signed)
Patient is 21 y.o. G2P1001 8339w6d admitted for SROM/SOL.    Delivery Note At 10:25 PM a viable female was delivered via  (Presentation: Right Occiput Anterior ).  APGAR:9 ,9 ; weight pending.   Placenta status: spontaneous, intact delivered with gentle traction.  Cord:  3 vessel with the following complications: none.    Anesthesia:  Epidural, 10 cc Lidocaine  Lacerations:  1st degree perineal  Suture Repair: vicryl Est. Blood Loss (mL):  50  Mom to postpartum.  Baby to Couplet care / Skin to Skin.  De HollingsheadCatherine L Wallace 08/21/2016, 10:43 PM  OB FELLOW DELIVERY ATTESTATION  I was gloved and present for the delivery in its entirety, and I agree with the above resident's note.    Ernestina PennaNicholas Schenk, MD 11:47 PM

## 2016-07-19 LAB — OB RESULTS CONSOLE GC/CHLAMYDIA
CHLAMYDIA, DNA PROBE: NEGATIVE
GC PROBE AMP, GENITAL: NEGATIVE

## 2016-07-19 LAB — OB RESULTS CONSOLE GBS: STREP GROUP B AG: NEGATIVE

## 2016-08-16 ENCOUNTER — Telehealth (HOSPITAL_COMMUNITY): Payer: Self-pay | Admitting: *Deleted

## 2016-08-16 NOTE — Telephone Encounter (Signed)
Preadmission screen  

## 2016-08-20 ENCOUNTER — Other Ambulatory Visit: Payer: Self-pay | Admitting: Advanced Practice Midwife

## 2016-08-21 ENCOUNTER — Encounter (HOSPITAL_COMMUNITY): Payer: Self-pay | Admitting: *Deleted

## 2016-08-21 ENCOUNTER — Inpatient Hospital Stay (HOSPITAL_COMMUNITY): Payer: Medicaid Other | Admitting: Anesthesiology

## 2016-08-21 ENCOUNTER — Inpatient Hospital Stay (HOSPITAL_COMMUNITY)
Admission: AD | Admit: 2016-08-21 | Discharge: 2016-08-23 | DRG: 775 | Disposition: A | Discharge status: Home / Self Care | Payer: Medicaid Other | Source: Ambulatory Visit | Attending: Obstetrics and Gynecology | Admitting: Obstetrics and Gynecology

## 2016-08-21 DIAGNOSIS — O99214 Obesity complicating childbirth: Secondary | ICD-10-CM | POA: Diagnosis present

## 2016-08-21 DIAGNOSIS — O4202 Full-term premature rupture of membranes, onset of labor within 24 hours of rupture: Secondary | ICD-10-CM | POA: Diagnosis present

## 2016-08-21 DIAGNOSIS — Z6841 Body Mass Index (BMI) 40.0 and over, adult: Secondary | ICD-10-CM | POA: Diagnosis not present

## 2016-08-21 DIAGNOSIS — O480 Post-term pregnancy: Secondary | ICD-10-CM | POA: Diagnosis present

## 2016-08-21 DIAGNOSIS — Z3A40 40 weeks gestation of pregnancy: Secondary | ICD-10-CM

## 2016-08-21 DIAGNOSIS — Z3A41 41 weeks gestation of pregnancy: Secondary | ICD-10-CM

## 2016-08-21 HISTORY — DX: Post-term pregnancy: O48.0

## 2016-08-21 HISTORY — DX: Gestational (pregnancy-induced) hypertension without significant proteinuria, unspecified trimester: O13.9

## 2016-08-21 LAB — CBC
HCT: 35.6 % — ABNORMAL LOW (ref 36.0–46.0)
HCT: 36.4 % (ref 36.0–46.0)
HEMOGLOBIN: 11.9 g/dL — AB (ref 12.0–15.0)
Hemoglobin: 12.1 g/dL (ref 12.0–15.0)
MCH: 24.5 pg — ABNORMAL LOW (ref 26.0–34.0)
MCH: 24.7 pg — AB (ref 26.0–34.0)
MCHC: 33.4 g/dL (ref 30.0–36.0)
MCHC: 33.2 g/dL (ref 30.0–36.0)
MCV: 73.4 fL — ABNORMAL LOW (ref 78.0–100.0)
MCV: 74.4 fL — ABNORMAL LOW (ref 78.0–100.0)
Platelets: 322 10*3/uL (ref 150–400)
Platelets: 299 10*3/uL (ref 150–400)
RBC: 4.85 MIL/uL (ref 3.87–5.11)
RBC: 4.89 MIL/uL (ref 3.87–5.11)
RDW: 17.4 % — ABNORMAL HIGH (ref 11.5–15.5)
RDW: 17.3 % — AB (ref 11.5–15.5)
WBC: 9.9 10*3/uL (ref 4.0–10.5)
WBC: 11.2 10*3/uL — ABNORMAL HIGH (ref 4.0–10.5)

## 2016-08-21 LAB — COMPREHENSIVE METABOLIC PANEL
ALT: 13 U/L — AB (ref 14–54)
ANION GAP: 9 (ref 5–15)
AST: 18 U/L (ref 15–41)
Albumin: 3 g/dL — ABNORMAL LOW (ref 3.5–5.0)
Alkaline Phosphatase: 167 U/L — ABNORMAL HIGH (ref 38–126)
BUN: 7 mg/dL (ref 6–20)
CHLORIDE: 108 mmol/L (ref 101–111)
CO2: 20 mmol/L — ABNORMAL LOW (ref 22–32)
CREATININE: 0.52 mg/dL (ref 0.44–1.00)
Calcium: 9.2 mg/dL (ref 8.9–10.3)
GFR calc non Af Amer: >60 mL/min (ref >60)
Glucose, Bld: 100 mg/dL — ABNORMAL HIGH (ref 65–99)
POTASSIUM: 3.9 mmol/L (ref 3.5–5.1)
SODIUM: 137 mmol/L (ref 135–145)
Total Bilirubin: 0.5 mg/dL (ref 0.3–1.2)
Total Protein: 6.5 g/dL (ref 6.5–8.1)

## 2016-08-21 LAB — TYPE AND SCREEN
ABO/RH(D): O POS
ANTIBODY SCREEN: NEGATIVE

## 2016-08-21 MED ORDER — FENTANYL 2.5 MCG/ML BUPIVACAINE 1/10 % EPIDURAL INFUSION (WH - ANES)
14.0000 mL/h | INTRAMUSCULAR | Status: DC | PRN
  Administered 2016-08-21 (×2): 14 mL/h via EPIDURAL

## 2016-08-21 MED ORDER — PHENYLEPHRINE 40 MCG/ML (10ML) SYRINGE FOR IV PUSH (FOR BLOOD PRESSURE SUPPORT)
80.0000 ug | PREFILLED_SYRINGE | INTRAVENOUS | Status: DC | PRN
  Filled 2016-08-21: qty 5

## 2016-08-21 MED ORDER — OXYTOCIN 40 UNITS IN LACTATED RINGERS INFUSION - SIMPLE MED
2.5000 [IU]/h | INTRAVENOUS | Status: DC
  Administered 2016-08-21: 2.5 [IU]/h via INTRAVENOUS
  Filled 2016-08-21: qty 1000

## 2016-08-21 MED ORDER — LACTATED RINGERS IV SOLN: 500.0000 mL | Freq: Once | INTRAVENOUS | Status: DC

## 2016-08-21 MED ORDER — LIDOCAINE HCL (PF) 1 % IJ SOLN
INTRAMUSCULAR | Status: DC | PRN
  Administered 2016-08-21: 2 mL via EPIDURAL
  Administered 2016-08-21: 3 mL via EPIDURAL
  Administered 2016-08-21: 5 mL via EPIDURAL

## 2016-08-21 MED ORDER — VALACYCLOVIR HCL 500 MG PO TABS
500.0000 mg | ORAL_TABLET | Freq: Two times a day (BID) | ORAL | Status: DC
  Administered 2016-08-22 (×2): 500 mg via ORAL
  Filled 2016-08-21 (×2): qty 1

## 2016-08-21 MED ORDER — FENTANYL CITRATE (PF) 100 MCG/2ML IJ SOLN: 100.0000 ug | INTRAMUSCULAR | Status: DC | PRN

## 2016-08-21 MED ORDER — ONDANSETRON HCL 4 MG/2ML IJ SOLN: 4.0000 mg | Freq: Four times a day (QID) | INTRAMUSCULAR | Status: DC | PRN

## 2016-08-21 MED ORDER — DIPHENHYDRAMINE HCL 50 MG/ML IJ SOLN: 12.5000 mg | INTRAMUSCULAR | Status: DC | PRN

## 2016-08-21 MED ORDER — LACTATED RINGERS IV SOLN
INTRAVENOUS | Status: DC
  Administered 2016-08-21: 21:00:00 via INTRAVENOUS

## 2016-08-21 MED ORDER — OXYTOCIN BOLUS FROM INFUSION
500.0000 mL | Freq: Once | INTRAVENOUS | Status: AC
  Administered 2016-08-21: 500 mL via INTRAVENOUS

## 2016-08-21 MED ORDER — FENTANYL CITRATE (PF) 100 MCG/2ML IJ SOLN
INTRAMUSCULAR | Status: AC
  Administered 2016-08-21: 100 ug via INTRAVENOUS
  Filled 2016-08-21: qty 2

## 2016-08-21 MED ORDER — LACTATED RINGERS IV SOLN
500.0000 mL | Freq: Once | INTRAVENOUS | Status: AC
  Administered 2016-08-21: 500 mL via INTRAVENOUS

## 2016-08-21 MED ORDER — MISOPROSTOL 25 MCG QUARTER TABLET
25.0000 ug | ORAL_TABLET | ORAL | Status: DC | PRN
  Filled 2016-08-21: qty 1

## 2016-08-21 MED ORDER — EPHEDRINE 5 MG/ML INJ
10.0000 mg | INTRAVENOUS | Status: DC | PRN
  Filled 2016-08-21: qty 4

## 2016-08-21 MED ORDER — SOD CITRATE-CITRIC ACID 500-334 MG/5ML PO SOLN: 30.0000 mL | ORAL | Status: DC | PRN

## 2016-08-21 MED ORDER — OXYCODONE-ACETAMINOPHEN 5-325 MG PO TABS: 1.0000 | ORAL_TABLET | ORAL | Status: DC | PRN

## 2016-08-21 MED ORDER — TERBUTALINE SULFATE 1 MG/ML IJ SOLN
0.2500 mg | Freq: Once | INTRAMUSCULAR | Status: DC | PRN
  Filled 2016-08-21: qty 1

## 2016-08-21 MED ORDER — ACETAMINOPHEN 325 MG PO TABS: 650.0000 mg | ORAL_TABLET | ORAL | Status: DC | PRN

## 2016-08-21 MED ORDER — LACTATED RINGERS IV SOLN: 500.0000 mL | INTRAVENOUS | Status: DC | PRN

## 2016-08-21 MED ORDER — PHENYLEPHRINE 40 MCG/ML (10ML) SYRINGE FOR IV PUSH (FOR BLOOD PRESSURE SUPPORT)
PREFILLED_SYRINGE | INTRAVENOUS | Status: AC
  Filled 2016-08-21: qty 20

## 2016-08-21 MED ORDER — OXYCODONE-ACETAMINOPHEN 5-325 MG PO TABS: 2.0000 | ORAL_TABLET | ORAL | Status: DC | PRN

## 2016-08-21 MED ORDER — FENTANYL CITRATE (PF) 100 MCG/2ML IJ SOLN
100.0000 ug | INTRAMUSCULAR | Status: DC | PRN
  Administered 2016-08-21: 100 ug via INTRAVENOUS

## 2016-08-21 MED ORDER — LIDOCAINE HCL (PF) 1 % IJ SOLN
30.0000 mL | INTRAMUSCULAR | Status: DC | PRN
  Administered 2016-08-21: 30 mL via SUBCUTANEOUS
  Filled 2016-08-21: qty 30

## 2016-08-21 MED ORDER — FENTANYL 2.5 MCG/ML BUPIVACAINE 1/10 % EPIDURAL INFUSION (WH - ANES)
INTRAMUSCULAR | Status: AC
  Filled 2016-08-21: qty 100

## 2016-08-21 NOTE — H&P (Signed)
LABOR ADMISSION HISTORY AND PHYSICAL  Julie Finley is a 21 y.o. female G2P1001 with IUP at 17w6dby 20 wk sono presenting for SROM at 1900. Had been a planned IOL for postdates on 2/25. She reports +FMs, No LOF, no VB, no blurry vision, headaches or peripheral edema, and RUQ pain.  She plans on breast feeding. She request mirena for birth control.  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 Medical history non-contributory   . Pregnancy induced hypertension    had postpartum magnesium    Past Surgical History: Past Surgical History:  Procedure Laterality Date  . NO PAST SURGERIES      Obstetrical History: OB History    Gravida Para Term Preterm AB Living   _0 SAB TAB Ectopic Multiple Live Births         0 1      Social History: Social History   Social History  . Marital status: Single    Spouse name: N/A  . Number of children: N/A  . Years of education: N/A   Social History Main Topics  . Smoking status: Never Smoker  . Smokeless tobacco: Never Used  . Alcohol use No  . Drug use: No  . Sexual activity: Yes    Birth control/ protection: None   Other Topics Concern  . None   Social History Narrative  . None    Family History: History reviewed. No pertinent family history.  Allergies: No Known Allergies  Prescriptions Prior to Admission  Medication Sig Dispense Refill Last Dose  . Prenatal Vit-Fe Fumarate-FA (PRENATAL VITAMIN PO) Take 1 tablet by mouth daily.    08/20/2016 at Unknown time  . cephALEXin (KEFLEX) 500 MG capsule Take 1 capsule (500 mg total) by mouth 4 (four) times daily. 40 capsule 0   . ibuprofen (ADVIL,MOTRIN) 800 MG tablet Take 1 tablet (800 mg total) by mouth 3 (three) times daily. 21  tablet 0   . ondansetron (ZOFRAN ODT) 4 MG disintegrating tablet Take 1 tablet (4 mg total) by mouth every 8 (eight) hours as needed for nausea. 10 tablet 0      Review of Systems   All systems reviewed and negative except as stated in HPI  Blood pressure 142/87, pulse 71, temperature 98.6 F (37 C), resp. rate 18, height _1  (1.676 m), weight 115.7 kg (255 lb), unknown if currently breastfeeding. General appearance: alert and cooperative, uncomfortable with ctx  Lungs: clear to auscultation bilaterally Heart: regular rate and rhythm Abdomen: soft, non-tender; bowel sounds normal Extremities: Homans sign is negative, no sign of DVT Presentation: cephalic Fetal monitoringBaseline: 125 bpm, Variability: Good {> 6 bpm), Accelerations: Reactive and Decelerations: Absent Uterine activityFrequency: Every 1-3 minutes   Cervical Exam: 5/70/-1 without membranes appreciated   Prenatal labs: ABO, Rh:  O+ Antibody:  Neg  Rubella: Non-Immune RPR: Nonreactive (10/02 0000)  HBsAg:   Negative  HIV: Non-reactive (10/02 0000)  GBS: Negative (01/22 0000)  1 hr Glucola 121 Genetic screening  Declined  Anatomy UK[place]Normal   Prenatal Transfer Tool  Maternal Diabetes: No Genetic Screening: Declined Maternal Ultrasounds/Referrals: Normal Fetal Ultrasounds or other Referrals:  None Maternal Substance Abuse:  No Significant Maternal Medications:  Meds include: Other: ASA  81 mg and Valtrex  Significant Maternal Lab Results: Lab values include: Group B Strep negative  Results for orders placed or performed during the hospital encounter of 08/21/16 (from the past 24 hour(s))  CBC   Collection Time: 08/21/16  8:39 PM  Result Value Ref Range   WBC 9.9 4.0 - 10.5 K/uL   RBC 4.85 3.87 - 5.11 MIL/uL   Hemoglobin 11.9 (L) 12.0 - 15.0 g/dL   HCT 35.6 (L) 36.0 - 46.0 %   MCV 73.4 (L) 78.0 - 100.0 fL   MCH 24.5 (L) 26.0 - 34.0 pg   MCHC 33.4 30.0 - 36.0 g/dL   RDW 17.4 (H) 11.5 - 15.5 %   Platelets  322 150 - 400 K/uL    Patient Active Problem List   Diagnosis Date Noted  . Post term pregnancy at [redacted] weeks gestation 08/21/2016  . Periurethral laceration, delivered, current hospitalization 12/09/2014  . Vaginal delivery 12/07/2014  . Abnormal MSAFP (maternal serum alpha-fetoprotein), decreased   . Abnormal AFP3 test   . Abnormal fetal ultrasound     Assessment: Julie Finley is a 21 y.o. G2P1001 at 52w6dhere for SROM/SOL.   #Labor: expectant management  #Pain: Epidural upon request  #FWB: Cat I  #ID:  GBS neg  #MOF: Breast  #MOC: Mirena  #Circ:  N/A  #Rubella Non-Immune: MMR vaccine PP   CMelina Schools2/24/2018, 8:58 PM  OB FELLOW HISTORY AND PHYSICAL ATTESTATION  I have seen and examined this patient; I agree with above documentation in the resident's note.   Patient is a 21y/o G2P1 @ 40 wks and 6 days who presents in labor with rupured membranes. She has no complications this pregnancy.  Gen. Well Appearing NAD Pul: No respiratory Distress CV: RR, intact distal pulses Abd: gravid, non-tender Ext: 1+ edema   A/P 21y/o G2P1 @ 40 and 6 with Contractions and ruptured membranes. Admit and manage expectantly.    NJacquiline DoeMD 08/21/2016, 11:19 PM

## 2016-08-21 NOTE — MAU Note (Signed)
Leaking fld for about an hour. Continued to leak clear fld. Contractions started after leaking started. Closed on Monday. For IOL Sunday am

## 2016-08-21 NOTE — Anesthesia Pain Management Evaluation Note (Signed)
  CRNA Pain Management Visit Note  Patient: Julie Finley, 21 y.o., female  "Hello I am a [facility] the [facility]. We have an anesthesia team available at all times to provide care throughout the hospital, including epidural management and anesthesia for C-section. I don't know your plan for the delivery whether it a natural birth, water birth, IV sedation, nitrous supplementation, doula or epidural, but we want to meet your pain goals."   1.Was your pain managed to your expectations on prior hospitalizations?   Yes   2.What is your expectation for pain management during this hospitalization?     Epidural  3.How can we help you reach that goal? Epidural in place and working well  Record the patient's initial score and the patient's pain goal.   Pain: 0  Pain Goal: 5 [facility] wants you to be able to say your pain was always managed very well.  Julie Finley 08/21/2016

## 2016-08-21 NOTE — Progress Notes (Signed)
To 162 from Triage for further eval of srom and labor

## 2016-08-21 NOTE — Anesthesia Preprocedure Evaluation (Signed)
Anesthesia Evaluation  Patient identified by MRN, date of birth, ID band Patient awake    Reviewed: Allergy & Precautions, NPO status , Patient's Chart, lab work & pertinent test results  Airway Mallampati: III  TM Distance: >3 FB Neck ROM: Full    Dental  (+) Teeth Intact, Dental Advisory Given   Pulmonary neg pulmonary ROS,    Pulmonary exam normal breath sounds clear to auscultation       Cardiovascular hypertension, Normal cardiovascular exam Rhythm:Regular Rate:Normal     Neuro/Psych negative neurological ROS     GI/Hepatic negative GI ROS, Neg liver ROS,   Endo/Other  Morbid obesity  Renal/GU negative Renal ROS     Musculoskeletal negative musculoskeletal ROS (+)   Abdominal   Peds  Hematology  (+) Blood dyscrasia, anemia , Plt 322k   Anesthesia Other Findings Day of surgery medications reviewed with the patient.  Reproductive/Obstetrics (+) Pregnancy                             Anesthesia Physical Anesthesia Plan  ASA: III  Anesthesia Plan: Epidural   Post-op Pain Management:    Induction:   Airway Management Planned:   Additional Equipment:   Intra-op Plan:   Post-operative Plan:   Informed Consent: I have reviewed the patients History and Physical, chart, labs and discussed the procedure including the risks, benefits and alternatives for the proposed anesthesia with the patient or authorized representative who has indicated his/her understanding and acceptance.   Dental advisory given  Plan Discussed with:   Anesthesia Plan Comments: (Patient identified. Risks/Benefits/Options discussed with patient including but not limited to bleeding, infection, nerve damage, paralysis, failed block, incomplete pain control, headache, blood pressure changes, nausea, vomiting, reactions to medication both or allergic, itching and postpartum back pain. Confirmed with bedside nurse  the patient's most recent platelet count. Confirmed with patient that they are not currently taking any anticoagulation, have any bleeding history or any family history of bleeding disorders. Patient expressed understanding and wished to proceed. All questions were answered. )        Anesthesia Quick Evaluation

## 2016-08-21 NOTE — Anesthesia Procedure Notes (Signed)
Epidural Patient location during procedure: OB Start time: 08/21/2016 9:06 PM End time: 08/21/2016 9:11 PM  Staffing Anesthesiologist: Cecile HearingURK, STEPHEN EDWARD Performed: anesthesiologist   Preanesthetic Checklist Completed: patient identified, pre-op evaluation, timeout performed, IV checked, risks and benefits discussed and monitors and equipment checked  Epidural Patient position: sitting Prep: DuraPrep Patient monitoring: blood pressure and continuous pulse ox Approach: midline Location: L3-L4 Injection technique: LOR air  Needle:  Needle type: Tuohy  Needle gauge: 17 G Needle length: 9 cm Needle insertion depth: 9 cm Catheter size: 19 Gauge Catheter at skin depth: 15 cm Test dose: negative and Other (1% Lidocaine)  Additional Notes Patient identified.  Risk benefits discussed including failed block, incomplete pain control, headache, nerve damage, paralysis, blood pressure changes, nausea, vomiting, reactions to medication both toxic or allergic, and postpartum back pain.  Patient expressed understanding and wished to proceed.  All questions were answered.  Sterile technique used throughout procedure and epidural site dressed with sterile barrier dressing. No paresthesia or other complications noted. The patient did not experience any signs of intravascular injection such as tinnitus or metallic taste in mouth nor signs of intrathecal spread such as rapid motor block. Please see nursing notes for vital signs. Reason for block:procedure for pain

## 2016-08-22 ENCOUNTER — Inpatient Hospital Stay (HOSPITAL_COMMUNITY): Admission: RE | Admit: 2016-08-22 | Payer: BLUE CROSS/BLUE SHIELD | Source: Ambulatory Visit

## 2016-08-22 LAB — RPR: RPR: NONREACTIVE

## 2016-08-22 MED ORDER — DIPHENHYDRAMINE HCL 25 MG PO CAPS: 25.0000 mg | ORAL_CAPSULE | Freq: Four times a day (QID) | ORAL | Status: DC | PRN

## 2016-08-22 MED ORDER — IBUPROFEN 600 MG PO TABS
600.0000 mg | ORAL_TABLET | Freq: Four times a day (QID) | ORAL | Status: DC
  Administered 2016-08-22 – 2016-08-23 (×6): 600 mg via ORAL
  Filled 2016-08-22 (×6): qty 1

## 2016-08-22 MED ORDER — PRENATAL MULTIVITAMIN CH
1.0000 | ORAL_TABLET | Freq: Every day | ORAL | Status: DC
  Administered 2016-08-22: 1 via ORAL
  Filled 2016-08-22: qty 1

## 2016-08-22 MED ORDER — COCONUT OIL OIL: 1.0000 "application " | TOPICAL_OIL | Status: DC | PRN

## 2016-08-22 MED ORDER — SIMETHICONE 80 MG PO CHEW
80.0000 mg | CHEWABLE_TABLET | ORAL | Status: DC | PRN
Start: 2016-08-22 — End: 2016-08-23

## 2016-08-22 MED ORDER — ONDANSETRON HCL 4 MG PO TABS: 4.0000 mg | ORAL_TABLET | ORAL | Status: DC | PRN

## 2016-08-22 MED ORDER — ONDANSETRON HCL 4 MG/2ML IJ SOLN: 4.0000 mg | INTRAMUSCULAR | Status: DC | PRN

## 2016-08-22 MED ORDER — ZOLPIDEM TARTRATE 5 MG PO TABS: 5.0000 mg | ORAL_TABLET | Freq: Every evening | ORAL | Status: DC | PRN

## 2016-08-22 MED ORDER — SENNOSIDES-DOCUSATE SODIUM 8.6-50 MG PO TABS
2.0000 | ORAL_TABLET | ORAL | Status: DC
  Administered 2016-08-22 (×2): 2 via ORAL
  Filled 2016-08-22 (×2): qty 2

## 2016-08-22 MED ORDER — WITCH HAZEL-GLYCERIN EX PADS: 1.0000 "application " | MEDICATED_PAD | CUTANEOUS | Status: DC | PRN

## 2016-08-22 MED ORDER — MEASLES, MUMPS & RUBELLA VAC ~~LOC~~ INJ
0.5000 mL | INJECTION | Freq: Once | SUBCUTANEOUS | Status: DC
  Filled 2016-08-22 (×2): qty 0.5

## 2016-08-22 MED ORDER — DIBUCAINE 1 % RE OINT: 1.0000 "application " | TOPICAL_OINTMENT | RECTAL | Status: DC | PRN

## 2016-08-22 MED ORDER — BENZOCAINE-MENTHOL 20-0.5 % EX AERO
1.0000 "application " | INHALATION_SPRAY | CUTANEOUS | Status: DC | PRN
  Administered 2016-08-22: 1 via TOPICAL
  Filled 2016-08-22: qty 56

## 2016-08-22 MED ORDER — ACETAMINOPHEN 325 MG PO TABS: 650.0000 mg | ORAL_TABLET | ORAL | Status: DC | PRN

## 2016-08-22 MED ORDER — TETANUS-DIPHTH-ACELL PERTUSSIS 5-2.5-18.5 LF-MCG/0.5 IM SUSP: 0.5000 mL | Freq: Once | INTRAMUSCULAR | Status: DC

## 2016-08-22 NOTE — Discharge Instructions (Signed)

## 2016-08-22 NOTE — Anesthesia Postprocedure Evaluation (Signed)
Anesthesia Post Note  Patient: Julie Finley  Procedure(s) Performed: * No procedures listed *  Patient location during evaluation: Mother Baby Anesthesia Type: Epidural Level of consciousness: awake and alert and oriented Pain management: pain level controlled Vital Signs Assessment: post-procedure vital signs reviewed and stable Respiratory status: spontaneous breathing and nonlabored ventilation Cardiovascular status: stable Postop Assessment: no headache, patient able to bend at knees, no backache, no signs of nausea or vomiting, epidural receding and adequate PO intake Anesthetic complications: no        Last Vitals:  Vitals:   08/22/16 0120 08/22/16 0522  BP: 130/74 133/65  Pulse: 80 79  Resp: 18 18  Temp: 36.9 C 37.2 C    Last Pain:  Vitals:   08/22/16 0522  TempSrc: Oral  PainSc: 0-No pain   Pain Goal: Patients Stated Pain Goal: 0 (08/21/16 2000)               Laban EmperorMalinova,Kassy Mcenroe Hristova

## 2016-08-22 NOTE — Lactation Note (Signed)
This note was copied from a baby's chart. Lactation Consultation Note  Patient Name: Girl Sheppard CoilMegan Ribera ZOXWR'UToday's Date: 08/22/2016 Reason for consult: Initial assessment  Baby 22 hours old. Mom reports that she nursed her first child for 6 months. Mom states that this baby is latching well and she is seeing colostrum and hearing swallows. Assisted mom with positioning and baby latched deeply and suckled rhythmically with some swallows noted. Enc mom to continue to nurse with cues. Mom aware of OP/BFSG and LC phone line assistance after D/C.   Maternal Data    Feeding Feeding Type: Breast Fed Length of feed: 20 min  LATCH Score/Interventions Latch: Grasps breast easily, tongue down, lips flanged, rhythmical sucking.  Audible Swallowing: A few with stimulation Intervention(s): Skin to skin;Hand expression  Type of Nipple: Everted at rest and after stimulation  Comfort (Breast/Nipple): Soft / non-tender     Hold (Positioning): Assistance needed to correctly position infant at breast and maintain latch. Intervention(s): Breastfeeding basics reviewed;Support Pillows;Position options;Skin to skin  LATCH Score: 8  Lactation Tools Discussed/Used     Consult Status Consult Status: Follow-up Date: 08/23/16 Follow-up type: In-patient    Sherlyn HayJennifer D Robertson Colclough 08/22/2016, 9:17 PM

## 2016-08-22 NOTE — Progress Notes (Addendum)
Post Partum Day 1 Subjective: no complaints, up ad lib, voiding, tolerating PO, + flatus and no stool yet  Objective: Blood pressure 133/65, pulse 79, temperature 99 F (37.2 C), temperature source Oral, resp. rate 18, height 5\' 6"  (1.676 m), weight 255 lb (115.7 kg), SpO2 100 %, unknown if currently breastfeeding.  Physical Exam:  General: alert, cooperative and no distress Lochia: appropriate Uterine Fundus: firm Incision: N/A DVT Evaluation: No evidence of DVT seen on physical exam.   Recent Labs  08/21/16 2039 08/21/16 2321  HGB 11.9* 12.1  HCT 35.6* 36.4    Assessment/Plan: Plan for discharge tomorrow   LOS: 1 day   Wendee Beaversavid J McMullen, DO, PGY-1 08/22/2016, 7:09 AM    OB Fellow attestation I have seen and examined this patient and agree with above documentation in the McMullen's note.   Julie Finley is a 21 y.o. G2P1001 s/p SVD.   Pain is well controlled.  Plan for birth control is IUD.  Method of Feeding: breast  PE:  BP 133/65   Pulse 79   Temp 99 F (37.2 C) (Oral)   Resp 18   Ht 5\' 6"  (1.676 m)   Wt 255 lb (115.7 kg)   SpO2 100%   BMI 41.16 kg/m  Fundus firm Homan's sign negative, trace edema B/l   Recent Labs  08/21/16 2039 08/21/16 2321  HGB 11.9* 12.1  HCT 35.6* 36.4     Plan: discharge tomorow -BP's elevated immediately post partum. Improving at this time. Continue to monitor. PIH labs negative at that time.  - postpartum care discussed - f/u clinic in 6 weeks for postpartum visit   Julie PennaNicholas Priscilla Finklea, MD 7:50 AM

## 2016-08-22 NOTE — Progress Notes (Signed)
CSW received consult for "maternal hx mild depression."  CSW does not see this noted in her OB Prenatal Record, RN Admission Summary or current H&P.  CSW reviewed H&P from last delivery and also does not note this hx.  CSW is screening out referral at this time.  Please call CSW if current concerns arise or by MOB's request.

## 2016-08-23 ENCOUNTER — Encounter (HOSPITAL_COMMUNITY): Payer: Self-pay | Admitting: *Deleted

## 2016-08-23 MED ORDER — IBUPROFEN 600 MG PO TABS: 600.0000 mg | ORAL_TABLET | Freq: Four times a day (QID) | ORAL | 0 refills | Status: DC

## 2016-08-23 NOTE — Lactation Note (Signed)
This note was copied from a baby's chart. Lactation Consultation Note: Experienced BF mom reports baby just finished feeding for 20 min. Reports she is latching well with no pain and breasts are feeling a little fuller this morning. Reviewed engorgement prevention and treatment. Does not have pump for home. Offered manual pump and mom agreeable. Reviewed setup, use and cleaning of pump pieces. Asking about milk storage. Reviewed milk storage quidelines. No further questions at present. Reviewed our phone number, OP appointments and BFSG as resources for support after DC. To call prn  Patient Name: Julie [name] [id number]'s Date: 08/23/2016 Reason for consult: Follow-up assessment   Maternal Data Formula Feeding for Exclusion: No Has patient been taught Hand Expression?: Yes Does the patient have breastfeeding experience prior to this delivery?: Yes  Feeding Feeding Type: Breast Fed Length of feed: 20 min  LATCH Score/Interventions                      Lactation Tools Discussed/Used Pump Review: Setup, frequency, and cleaning;Milk Storage Initiated by:: DW Date initiated:: 08/23/16   Consult Status Consult Status: Complete    Pamelia HoitWeeks, Khanh Tanori D 08/23/2016, 10:19 AM

## 2016-08-23 NOTE — Discharge Summary (Signed)
OB Discharge Summary  Patient Name: Julie Finley DOB: 07/20/1995 MRN: [id number]  Date of admission: 08/21/2016 Delivering MD: Arvilla MarketWALLACE, CATHERINE LAUREN   Date of discharge: 08/23/2016  Admitting diagnosis: 40.6 WKS, WATER BROKE Intrauterine pregnancy: 3748w1d     Secondary diagnosis:Active Problems:   Post term pregnancy at [redacted] weeks gestation  Additional problems:none     Discharge diagnosis: Term Pregnancy Delivered                                                                     Post partum procedures:n/a  Augmentation: n/a  Complications: None  Hospital course:  Onset of Labor With Vaginal Delivery     21 y.o. yo G2P1001 at 8348w1d was admitted in Active Labor on 08/21/2016. Patient had an uncomplicated labor course as follows:  Membrane Rupture Time/Date: 7:00 PM ,08/21/2016   Intrapartum Procedures: Episiotomy: None [1]                                         Lacerations:  1st degree [2]  Patient had a delivery of a Viable infant. 08/21/2016  Information for the patient's newborn:  Julie Finley, Girl Julie Finley [[id number]][[id number]]  Delivery Method: Vaginal, Spontaneous Delivery (Filed from Delivery Summary)    Pateint had an uncomplicated postpartum course.  She is ambulating, tolerating a regular diet, passing flatus, and urinating well. Patient is discharged home in stable condition on 08/23/16.   Physical exam  Vitals:   08/22/16 0522 08/22/16 1211 08/22/16 1900 08/23/16 0500  BP: 133/65 125/71 (!) 143/81 131/71  Pulse: 79 68 81 74  Resp: 18 16 18 18   Temp: 99 F (37.2 C) 98.4 F (36.9 C) 98.7 F (37.1 C) 98.3 F (36.8 C)  TempSrc: Oral Oral Oral Oral  SpO2:   100%   Weight:      Height:       General: alert, cooperative and no distress Lochia: appropriate Uterine Fundus: firm Incision: N/A DVT Evaluation: No evidence of DVT seen on physical exam. Labs: Lab Results  Component Value Date   WBC 11.2 (H) 08/21/2016   HGB 12.1 08/21/2016   HCT 36.4 08/21/2016   MCV 74.4 (L) 08/21/2016   PLT 299 08/21/2016   CMP Latest Ref Rng & Units 08/21/2016  Glucose 65 - 99 mg/dL 782(N100(H)  BUN 6 - 20 mg/dL 7  Creatinine 5.620.44 - 1.301.00 mg/dL 8.650.52  Sodium 784135 - 696145 mmol/L 137  Potassium 3.5 - 5.1 mmol/L 3.9  Chloride 101 - 111 mmol/L 108  CO2 22 - 32 mmol/L 20(L)  Calcium 8.9 - 10.3 mg/dL 9.2  Total Protein 6.5 - 8.1 g/dL 6.5  Total Bilirubin 0.3 - 1.2 mg/dL 0.5  Alkaline Phos 38 - 126 U/L 167(H)  AST 15 - 41 U/L 18  ALT 14 - 54 U/L 13(L)    Discharge instruction: per After Visit Summary and "Baby and Me Booklet".  After Visit Meds:  Allergies as of 08/23/2016   No Known Allergies     Medication List    STOP taking these medications   aspirin 81 MG chewable tablet     TAKE these medications  ibuprofen 600 MG tablet Commonly known as:  ADVIL,MOTRIN Take 1 tablet (600 mg total) by mouth every 6 (six) hours.   PRENATAL GUMMIES/DHA & FA 0.4-32.5 MG Chew Chew 1 each by mouth 2 (two) times daily.   valACYclovir 500 MG tablet Commonly known as:  VALTREX Take 500 mg by mouth 2 (two) times daily.       Diet: routine diet  Activity: Advance as tolerated. Pelvic rest for 6 weeks.   Outpatient follow up:6 weeks Follow up Appt:No future appointments. Follow up visit: No Follow-up on file.  Postpartum contraception: IUD Mirena  Newborn Data: Live born female  Birth Weight: 7 lb 1.8 oz (3226 g) APGAR: 9, 9  Baby Feeding: Breast Disposition:home with mother   08/23/2016 Julie Finley, CNM

## 2017-06-20 ENCOUNTER — Encounter (HOSPITAL_COMMUNITY): Payer: Self-pay

## 2017-06-20 ENCOUNTER — Other Ambulatory Visit: Payer: Self-pay

## 2017-06-20 ENCOUNTER — Emergency Department (HOSPITAL_COMMUNITY)
Admission: EM | Admit: 2017-06-20 | Discharge: 2017-06-20 | Disposition: A | Discharge status: Home / Self Care | Payer: 59 | Attending: Emergency Medicine | Admitting: Emergency Medicine

## 2017-06-20 DIAGNOSIS — R220 Localized swelling, mass and lump, head: Secondary | ICD-10-CM | POA: Insufficient documentation

## 2017-06-20 DIAGNOSIS — F419 Anxiety disorder, unspecified: Secondary | ICD-10-CM | POA: Insufficient documentation

## 2017-06-20 HISTORY — DX: Anxiety disorder, unspecified: F41.9

## 2017-06-20 MED ORDER — FAMOTIDINE 20 MG PO TABS
20.0000 mg | ORAL_TABLET | Freq: Once | ORAL | Status: AC
  Administered 2017-06-20: 20 mg via ORAL
  Filled 2017-06-20: qty 1

## 2017-06-20 MED ORDER — FAMOTIDINE 20 MG PO TABS: 20.0000 mg | ORAL_TABLET | Freq: Two times a day (BID) | ORAL | 0 refills | Status: DC

## 2017-06-20 MED ORDER — PREDNISONE 20 MG PO TABS
40.0000 mg | ORAL_TABLET | Freq: Once | ORAL | Status: AC
  Administered 2017-06-20: 40 mg via ORAL
  Filled 2017-06-20: qty 2

## 2017-06-20 MED ORDER — EPINEPHRINE 0.3 MG/0.3ML IJ SOAJ: 0.3000 mg | Freq: Once | INTRAMUSCULAR | 1 refills | Status: AC

## 2017-06-20 MED ORDER — PREDNISONE 20 MG PO TABS: 20.0000 mg | ORAL_TABLET | Freq: Two times a day (BID) | ORAL | 0 refills | Status: AC

## 2017-06-20 MED ORDER — CETIRIZINE HCL 10 MG PO TABS: 10.0000 mg | ORAL_TABLET | Freq: Every day | ORAL | 0 refills | Status: DC

## 2017-06-20 NOTE — Discharge Instructions (Signed)
Please read and follow all provided instructions.  Your diagnoses today include:  1. Facial swelling   2. Anxiety     Tests performed today include: Vital signs. See below for your results today.   Medications prescribed:   Take any prescribed medications only as directed.  You are prescribed prednisone, a steroid in the ED for inflammation.  Common side effects include upset stomach/nausea. You may take this medicine with food if this occurs. Other side effects include restlessness, difficulty sleeping, and increased sweating. Call your healthcare provider if these do not resolve after finishing the medication.  This medicine may increase your blood sugar so additional careful monitoring is needed of blood sugar if you have diabetes. Call your healthcare provider for any signs/symtpoms of high blood sugar such as confusion, feeling sleepy, more thirst, more hunger, passing urine more often, flushing, fast breathing, or breath that smells like fruit.  EpiPen.  Please make sure that she you review the documents provided in this packet regarding how to administer it.  This should only be applied for any swelling in the face and not for tingling in the lips or face.  Home care instructions:  Follow any educational materials contained in this packet  Follow-up instructions: Please follow-up with your primary care provider in the next 3 days for further evaluation of your symptoms.   Return instructions:  Please return to the Emergency Department if you experience worsening symptoms.  Call 9-1-1 immediately if you have an allergic reaction that involves your lips, mouth, throat or if you have any difficulty breathing. This is a life-threatening emergency.  Please return if you have any other emergent concerns.  Additional Information:  To find a primary care or specialty doctor please call [phone] or [phone] to access "[facility] Find a Doctor Service."  You may also go  on the [facility] website at [email or web address]  There are also multiple [facility] practices throughout the [place] that are frequently accepting new patients. You may find a clinic that is close to your home and contact them.  [facility] - [address] [place] [phone]  [facility] in [place] (also locations in [place] and [place]) - [address] [place] [phone]  [facility] - [address] [place] [phone]   Your vital signs today were: BP (!) 139/93 (BP Location: Right Arm)    Pulse 67    Temp 98.3 F (36.8 C) (Oral)    Resp 15    SpO2 100%  If your blood pressure (BP) was elevated above 130/80 this visit, please have this repeated by your doctor within one month. --------------  Thank you for allowing us to participate in your care today!

## 2017-06-20 NOTE — ED Triage Notes (Signed)
She c/o some swelling of her mouth and fine wheezing after drinking water at a [employer]. I do not see any swelling and her BBS are clear now. She ambulates without difficulty and her breathing is normal. She took 50mg  p.o. Benadryl of her own before arrival of EMS. She is in no distress.

## 2017-06-20 NOTE — ED Provider Notes (Signed)
[facility]-EMERGENCY DEPT Provider Note   CSN: [id number] Arrival date & time: 06/20/17  1358     History   Chief Complaint Chief Complaint  Patient presents with  . Allergic Reaction    HPI Julie Finley is a 21 y.o. female.  HPI   Patient is a 21 year old female with a history of anxiety and pregnancy-induced hypertension presenting for right-sided facial swelling and bilateral periorbital swelling earlier today that has since resolved.  Patient reports she was getting ready to go to lunch when she noticed the inside of her lip on her lower lip began to swell.  Patient felt that the right side of her face was swelling.  Additionally, patient reports that she was wheezing and having a hard time breathing at this time.  Patient did not develop hives.  Patient felt that her symptoms were more on the right and her friend observe the facial swelling.  There is no abdominal cramping, vomiting, or diarrhea.  Patient denies any sore throat preceding this.  No congestion, rhinorrhea, cough.  Patient had not eaten several hours prior to this incident.  Patient did not have any ingestion trigger prior to this incident.  No new medications. No ACEi. Patient self administered Benadryl which patient reports did not work initially, however after calling EMS after swelling began to resolve and she is asymptomatic at present.  Past Medical History:  Diagnosis Date  . Anxiety   . Medical history non-contributory   . Pregnancy induced hypertension    had postpartum magnesium    Patient Active Problem List   Diagnosis Date Noted  . Anxiety   . Post term pregnancy at [redacted] weeks gestation 08/21/2016  . Periurethral laceration, delivered, current hospitalization 12/09/2014  . Vaginal delivery 12/07/2014  . Abnormal MSAFP (maternal serum alpha-fetoprotein), decreased   . Abnormal AFP3 test   . Abnormal fetal ultrasound     Past Surgical History:  Procedure Laterality Date    . NO PAST SURGERIES      OB History    Gravida Para Term Preterm AB Living   2 1 1     1    SAB TAB Ectopic Multiple Live Births         0 1       Home Medications    Prior to Admission medications   Medication Sig Start Date End Date Taking? Authorizing Provider  diphenhydrAMINE (BENADRYL) 25 MG tablet Take 50 mg by mouth daily as needed (ALLERGIC REACTION).   Yes [provider]  levonorgestrel (MIRENA) 20 MCG/24HR IUD 1 each by Intrauterine route once.   Yes [provider]  cetirizine (ZYRTEC ALLERGY) 10 MG tablet Take 1 tablet (10 mg total) by mouth daily for 4 days. 06/20/17 06/24/17  Aviva Kluver B, PA-C  EPINEPHrine 0.3 mg/0.3 mL IJ SOAJ injection Inject 0.3 mLs (0.3 mg total) into the muscle once for 1 dose. 06/20/17 06/20/17  Aviva Kluver B, PA-C  famotidine (PEPCID) 20 MG tablet Take 1 tablet (20 mg total) by mouth 2 (two) times daily for 4 days. 06/20/17 06/24/17  Aviva Kluver B, PA-C  ibuprofen (ADVIL,MOTRIN) 600 MG tablet Take 1 tablet (600 mg total) by mouth every 6 (six) hours. Patient not taking: Reported on 06/20/2017 08/23/16   Montez Morita, CNM  predniSONE (DELTASONE) 20 MG tablet Take 1 tablet (20 mg total) by mouth 2 (two) times daily with a meal for 4 days. 06/20/17 06/24/17  Elisha Ponder, PA-C    Family History  No family history on file.  Social History Social History   Tobacco Use  . Smoking status: Never Smoker  . Smokeless tobacco: Never Used  Substance Use Topics  . Alcohol use: No  . Drug use: No     Allergies   Patient has no known allergies.   Review of Systems Review of Systems  Constitutional: Negative for chills and fever.  HENT: Positive for facial swelling. Negative for sore throat, trouble swallowing and voice change.   Respiratory: Negative for shortness of breath and wheezing.   Gastrointestinal: Negative for abdominal pain, nausea and vomiting.  Skin: Negative for color change and rash.      Physical Exam Updated Vital Signs BP (!) 139/93 (BP Location: Right Arm)   Pulse 67   Temp 98.3 F (36.8 C) (Oral)   Resp 15   SpO2 100%   Physical Exam  Constitutional: She appears well-developed and well-nourished. No distress.  Sitting comfortably in bed.  HENT:  Head: Normocephalic and atraumatic.  Eyes: Conjunctivae are normal. Right eye exhibits no discharge. Left eye exhibits no discharge.  EOMs normal to gross examination.  Neck: Normal range of motion.  Cardiovascular: Normal rate, regular rhythm and normal heart sounds.  Intact, 2+ radial pulse.  Pulmonary/Chest: Effort normal and breath sounds normal. She has no wheezes. She has no rales.  Normal respiratory effort. Patient converses comfortably. No audible wheeze or stridor.  Abdominal: She exhibits no distension. There is no tenderness.  Musculoskeletal: Normal range of motion.  Neurological: She is alert.  Cranial nerves intact to gross observation. Patient moves extremities without difficulty.  Skin: Skin is warm and dry. She is not diaphoretic.  Psychiatric: She has a normal mood and affect. Her behavior is normal. Judgment and thought content normal.  Nursing note and vitals reviewed.    ED Treatments / Results  Labs (all labs ordered are listed, but only abnormal results are displayed) Labs Reviewed - No data to display  EKG  EKG Interpretation None       Radiology No results found.  Procedures Procedures (including critical care time)  Medications Ordered in ED Medications  predniSONE (DELTASONE) tablet 40 mg (40 mg Oral Given 06/20/17 2004)  famotidine (PEPCID) tablet 20 mg (20 mg Oral Given 06/20/17 2004)     Initial Impression / Assessment and Plan / ED Course  I have reviewed the triage vital signs and the nursing notes.  Pertinent labs & imaging results that were available during my care of the patient were reviewed by me and considered in my medical decision making (see  chart for details).    Final Clinical Impressions(s) / ED Diagnoses   Final diagnoses:  Facial swelling   Patient is nontoxic-appearing and in no acute distress.  Patient does not exhibit any further facial swelling.  Oropharynx is clear.  Patient is phonating normally.  There is no wheezing.  It is unclear whether this was an anaphylactic reaction.  Patient does have a history of a similar reaction.  This was a seemingly unprovoked attack. Differential diagnosis also includes idiopathic angioedema and uvulitis.  I discussed with patient the diagnostic uncertainty as well as the need to treat it is allergic reaction.  I also provided follow-up to to allergist in town.  EpiPen administered as well as strict instructions on when to administer it.  This was discussed with Dr. Shaune Pollackameron Isaacs.  Patient was given return precautions for any recurrence of facial swelling or if she feels she needs to use  the EpiPen.  Patient is in understanding and agrees with plan of care.  ED Discharge Orders        Ordered    famotidine (PEPCID) 20 MG tablet  2 times daily     06/20/17 2041    predniSONE (DELTASONE) 20 MG tablet  2 times daily with meals     06/20/17 2041    cetirizine (ZYRTEC ALLERGY) 10 MG tablet  Daily     06/20/17 2041    EPINEPHrine 0.3 mg/0.3 mL IJ SOAJ injection   Once     06/20/17 2041       Delia ChimesMurray, Alyssa B, PA-C 06/20/17 2051    Shaune PollackIsaacs, Cameron, MD 06/22/17 0236

## 2018-03-05 ENCOUNTER — Emergency Department (HOSPITAL_BASED_OUTPATIENT_CLINIC_OR_DEPARTMENT_OTHER)
Admission: EM | Admit: 2018-03-05 | Discharge: 2018-03-05 | Disposition: A | Discharge status: Home / Self Care | Payer: Self-pay | Attending: Emergency Medicine | Admitting: Emergency Medicine

## 2018-03-05 ENCOUNTER — Emergency Department (HOSPITAL_BASED_OUTPATIENT_CLINIC_OR_DEPARTMENT_OTHER): Payer: Self-pay

## 2018-03-05 ENCOUNTER — Other Ambulatory Visit: Payer: Self-pay

## 2018-03-05 ENCOUNTER — Encounter (HOSPITAL_BASED_OUTPATIENT_CLINIC_OR_DEPARTMENT_OTHER): Payer: Self-pay | Admitting: Adult Health

## 2018-03-05 DIAGNOSIS — B349 Viral infection, unspecified: Secondary | ICD-10-CM | POA: Insufficient documentation

## 2018-03-05 DIAGNOSIS — F419 Anxiety disorder, unspecified: Secondary | ICD-10-CM | POA: Insufficient documentation

## 2018-03-05 LAB — PREGNANCY, URINE: Preg Test, Ur: NEGATIVE

## 2018-03-05 LAB — COMPREHENSIVE METABOLIC PANEL
ALT: 13 U/L (ref 0–44)
AST: 15 U/L (ref 15–41)
Albumin: 3.7 g/dL (ref 3.5–5.0)
Alkaline Phosphatase: 74 U/L (ref 38–126)
Anion gap: 9 (ref 5–15)
BUN: 8 mg/dL (ref 6–20)
CHLORIDE: 105 mmol/L (ref 98–111)
CO2: 24 mmol/L (ref 22–32)
Calcium: 8.6 mg/dL — ABNORMAL LOW (ref 8.9–10.3)
Creatinine, Ser: 0.65 mg/dL (ref 0.44–1.00)
Glucose, Bld: 105 mg/dL — ABNORMAL HIGH (ref 70–99)
POTASSIUM: 4 mmol/L (ref 3.5–5.1)
SODIUM: 138 mmol/L (ref 135–145)
Total Bilirubin: 0.5 mg/dL (ref 0.3–1.2)
Total Protein: 7.3 g/dL (ref 6.5–8.1)

## 2018-03-05 LAB — I-STAT CG4 LACTIC ACID, ED: LACTIC ACID, VENOUS: 0.67 mmol/L (ref 0.5–1.9)

## 2018-03-05 LAB — CBC WITH DIFFERENTIAL/PLATELET
Basophils Absolute: 0 10*3/uL (ref 0.0–0.1)
Basophils Relative: 0 %
EOS ABS: 0 10*3/uL (ref 0.0–0.7)
Eosinophils Relative: 0 %
HCT: 39.9 % (ref 36.0–46.0)
Hemoglobin: 12.9 g/dL (ref 12.0–15.0)
LYMPHS ABS: 2.1 10*3/uL (ref 0.7–4.0)
Lymphocytes Relative: 22 %
MCH: 26.2 pg (ref 26.0–34.0)
MCHC: 32.3 g/dL (ref 30.0–36.0)
MCV: 80.9 fL (ref 78.0–100.0)
MONOS PCT: 8 %
Monocytes Absolute: 0.8 10*3/uL (ref 0.1–1.0)
NEUTROS PCT: 70 %
Neutro Abs: 6.5 10*3/uL (ref 1.7–7.7)
PLATELETS: 266 10*3/uL (ref 150–400)
RBC: 4.93 MIL/uL (ref 3.87–5.11)
RDW: 15 % (ref 11.5–15.5)
WBC: 9.3 10*3/uL (ref 4.0–10.5)

## 2018-03-05 LAB — URINALYSIS, MICROSCOPIC (REFLEX)

## 2018-03-05 LAB — URINALYSIS, ROUTINE W REFLEX MICROSCOPIC
Bilirubin Urine: NEGATIVE
Glucose, UA: NEGATIVE mg/dL
Ketones, ur: NEGATIVE mg/dL
LEUKOCYTES UA: NEGATIVE
Nitrite: NEGATIVE
PROTEIN: NEGATIVE mg/dL
SPECIFIC GRAVITY, URINE: 1.01 (ref 1.005–1.030)
pH: 7 (ref 5.0–8.0)

## 2018-03-05 MED ORDER — DIPHENHYDRAMINE HCL 50 MG/ML IJ SOLN
25.0000 mg | Freq: Once | INTRAMUSCULAR | Status: AC
  Administered 2018-03-05: 25 mg via INTRAVENOUS
  Filled 2018-03-05: qty 1

## 2018-03-05 MED ORDER — ACETAMINOPHEN 325 MG PO TABS
650.0000 mg | ORAL_TABLET | Freq: Once | ORAL | Status: AC
  Administered 2018-03-05: 650 mg via ORAL
  Filled 2018-03-05: qty 2

## 2018-03-05 MED ORDER — PROCHLORPERAZINE EDISYLATE 10 MG/2ML IJ SOLN
10.0000 mg | Freq: Once | INTRAMUSCULAR | Status: AC
  Administered 2018-03-05: 10 mg via INTRAVENOUS
  Filled 2018-03-05: qty 2

## 2018-03-05 MED ORDER — SODIUM CHLORIDE 0.9 % IV BOLUS
1000.0000 mL | Freq: Once | INTRAVENOUS | Status: AC
  Administered 2018-03-05: 1000 mL via INTRAVENOUS

## 2018-03-05 MED ORDER — KETOROLAC TROMETHAMINE 15 MG/ML IJ SOLN
15.0000 mg | Freq: Once | INTRAMUSCULAR | Status: AC
  Administered 2018-03-05: 15 mg via INTRAVENOUS
  Filled 2018-03-05: qty 1

## 2018-03-05 NOTE — Discharge Instructions (Signed)
Take tylenol, motrin for fever and headaches.   You likely have viral illness.   Rest for tomorrow  See your doctor next week   Return to ER if you have worse headaches, neck pain, trouble swallowing.

## 2018-03-05 NOTE — ED Notes (Signed)
Patient transported to X-ray 

## 2018-03-05 NOTE — ED Triage Notes (Addendum)
Presents with 2 days history of hedache, She has never had a headache before. The headache began at 2 am and has progressively worsened. IT is behind her left eye and into her neck. Pain is decribed as constant but more sharp at times. Endorses photophobia. She is febrile 101.2 here. Endorses nausea. Alert and oriented. Able to touch her chin to chest. Denies rashes.

## 2018-03-05 NOTE — ED Provider Notes (Signed)
  Physical Exam  BP 124/66 (BP Location: Left Arm)   Pulse 67   Temp (!) 101.2 F (38.4 C) (Oral)   Resp 16   Ht 5\' 6"  (1.676 m)   Wt 118.4 kg   SpO2 99%   BMI 42.13 kg/m   Physical Exam  ED Course/Procedures     Procedures  MDM  Patient here with headache, neck pain. No meningeal signs. Nl WBC count, nl lactate. Sign out pending UA. UA normal. Stable for discharge.     Charlynne Pander, MD 03/05/18 [phone]

## 2018-03-05 NOTE — ED Provider Notes (Signed)
[facility] Emergency Department Provider Note MRN:  [id number]  Arrival date & time: 03/05/18     Chief Complaint   Headache and Fever   History of Present Illness   Julie Finley is a 22 y.o. year-old female with no pertinent medical history presenting to the ED with chief complaint of headache and fever.  2 days of gradual onset frontal dull headache, mild to moderate in severity.  Associated with fever, stuffy nose, swelling to bilateral eyelids, dry cough.  Denies neck pain or stiffness, no shortness of breath, no abdominal pain, no dysuria, no rash.  Symptoms not improved with over-the-counter medications last night.  Review of Systems  A complete 10 system review of systems was obtained and all systems are negative except as noted in the HPI and PMH.  Patient's Health History    Past Medical History:  Diagnosis Date  . Anxiety   . Medical history non-contributory   . Pregnancy induced hypertension    had postpartum magnesium    Past Surgical History:  Procedure Laterality Date  . NO PAST SURGERIES      History reviewed. No pertinent family history.  Social History   Socioeconomic History  . Marital status: Single    Spouse name: Not on file  . Number of children: Not on file  . Years of education: Not on file  . Highest education level: Not on file  Occupational History  . Not on file  Social Needs  . Financial resource strain: Not on file  . Food insecurity:    Worry: Not on file    Inability: Not on file  . Transportation needs:    Medical: Not on file    Non-medical: Not on file  Tobacco Use  . Smoking status: Never Smoker  . Smokeless tobacco: Never Used  Substance and Sexual Activity  . Alcohol use: No  . Drug use: No  . Sexual activity: Yes    Birth control/protection: None  Lifestyle  . Physical activity:    Days per week: Not on file    Minutes per session: Not on file  . Stress: Not on file  Relationships  .  Social connections:    Talks on phone: Not on file    Gets together: Not on file    Attends religious service: Not on file    Active member of club or organization: Not on file    Attends meetings of clubs or organizations: Not on file    Relationship status: Not on file  . Intimate partner violence:    Fear of current or ex partner: Not on file    Emotionally abused: Not on file    Physically abused: Not on file    Forced sexual activity: Not on file  Other Topics Concern  . Not on file  Social History Narrative  . Not on file     Physical Exam  Vital Signs and Nursing Notes reviewed Vitals:   03/05/18 1340 03/05/18 1551  BP: 122/78 124/66  Pulse: 90 67  Resp: 18 16  Temp: (!) 101.2 F (38.4 C)   SpO2: 99% 99%    CONSTITUTIONAL: Well-appearing, NAD NEURO:  Alert and oriented x 3, no focal deficits, no meningismus EYES:  eyes equal and reactive ENT/NECK:  no LAD, no JVD CARDIO: Regular rate, well-perfused, normal S1 and S2 PULM:  CTAB no wheezing or rhonchi GI/GU:  normal bowel sounds, non-distended, non-tender MSK/SPINE:  No gross deformities, no edema SKIN:  no rash, atraumatic PSYCH:  Appropriate speech and behavior  Diagnostic and Interventional Summary    EKG Interpretation  Date/Time:    Ventricular Rate:    PR Interval:    QRS Duration:   QT Interval:    QTC Calculation:   R Axis:     Text Interpretation:        Labs Reviewed  COMPREHENSIVE METABOLIC PANEL - Abnormal; Notable for the following components:      Result Value   Glucose, Bld 105 (*)    Calcium 8.6 (*)    All other components within normal limits  CBC WITH DIFFERENTIAL/PLATELET  PREGNANCY, URINE  URINALYSIS, ROUTINE W REFLEX MICROSCOPIC  I-STAT CG4 LACTIC ACID, ED    DG Chest 2 View  Final Result      Medications  acetaminophen (TYLENOL) tablet 650 mg (650 mg Oral Given 03/05/18 1349)  prochlorperazine (COMPAZINE) injection 10 mg (10 mg Intravenous Given 03/05/18 1431)  ketorolac  (TORADOL) 15 MG/ML injection 15 mg (15 mg Intravenous Given 03/05/18 1431)  diphenhydrAMINE (BENADRYL) injection 25 mg (25 mg Intravenous Given 03/05/18 1428)  sodium chloride 0.9 % bolus 1,000 mL (0 mLs Intravenous Stopped 03/05/18 1528)     Procedures Critical Care  ED Course and Medical Decision Making  I have reviewed the triage vital signs and the nursing notes.  Pertinent labs & imaging results that were available during my care of the patient were reviewed by me and considered in my medical decision making (see below for details).    Favoring viral illness in this 22 year old female with cold-like symptoms, fever dull frontal headache.  Duration of illness and lack of meningeal signs excludes bacterial meningitis.  Will provide symptomatic medications here, reassess.  Feeling better after migraine cocktail, awaiting urinalysis.  Favoring URI.  Anticipating discharge.  Signed out to Dr. Silverio Lay at shift change.  Elmer Sow. Pilar Plate, MD [facility] Emergency [facility] [email or web address]  Final Clinical Impressions(s) / ED Diagnoses     ICD-10-CM   1. Viral illness B34.9     ED Discharge Orders    None         Sabas Sous, MD 03/05/18 1620

## 2018-07-21 ENCOUNTER — Other Ambulatory Visit: Payer: Self-pay

## 2018-07-21 ENCOUNTER — Encounter (HOSPITAL_BASED_OUTPATIENT_CLINIC_OR_DEPARTMENT_OTHER): Payer: Self-pay | Admitting: Emergency Medicine

## 2018-07-21 ENCOUNTER — Emergency Department (HOSPITAL_BASED_OUTPATIENT_CLINIC_OR_DEPARTMENT_OTHER)
Admission: EM | Admit: 2018-07-21 | Discharge: 2018-07-21 | Disposition: A | Discharge status: Home / Self Care | Payer: Self-pay | Attending: Emergency Medicine | Admitting: Emergency Medicine

## 2018-07-21 DIAGNOSIS — Y999 Unspecified external cause status: Secondary | ICD-10-CM | POA: Insufficient documentation

## 2018-07-21 DIAGNOSIS — Z79899 Other long term (current) drug therapy: Secondary | ICD-10-CM | POA: Insufficient documentation

## 2018-07-21 DIAGNOSIS — Y939 Activity, unspecified: Secondary | ICD-10-CM | POA: Insufficient documentation

## 2018-07-21 DIAGNOSIS — R51 Headache: Secondary | ICD-10-CM | POA: Insufficient documentation

## 2018-07-21 DIAGNOSIS — M542 Cervicalgia: Secondary | ICD-10-CM | POA: Insufficient documentation

## 2018-07-21 DIAGNOSIS — Y929 Unspecified place or not applicable: Secondary | ICD-10-CM | POA: Insufficient documentation

## 2018-07-21 MED ORDER — METHOCARBAMOL 500 MG PO TABS: 500.0000 mg | ORAL_TABLET | Freq: Three times a day (TID) | ORAL | 0 refills | Status: DC | PRN

## 2018-07-21 MED ORDER — NAPROXEN 500 MG PO TABS: 500.0000 mg | ORAL_TABLET | Freq: Two times a day (BID) | ORAL | 0 refills | Status: DC
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
## 2019-01-15 ENCOUNTER — Emergency Department (HOSPITAL_BASED_OUTPATIENT_CLINIC_OR_DEPARTMENT_OTHER)
Admission: EM | Admit: 2019-01-15 | Discharge: 2019-01-15 | Disposition: A | Discharge status: Home / Self Care | Payer: Self-pay | Attending: Emergency Medicine | Admitting: Emergency Medicine

## 2019-01-15 ENCOUNTER — Encounter (HOSPITAL_BASED_OUTPATIENT_CLINIC_OR_DEPARTMENT_OTHER): Payer: Self-pay

## 2019-01-15 ENCOUNTER — Other Ambulatory Visit: Payer: Self-pay

## 2019-01-15 ENCOUNTER — Emergency Department (HOSPITAL_BASED_OUTPATIENT_CLINIC_OR_DEPARTMENT_OTHER): Payer: Self-pay

## 2019-01-15 DIAGNOSIS — F17210 Nicotine dependence, cigarettes, uncomplicated: Secondary | ICD-10-CM | POA: Insufficient documentation

## 2019-01-15 DIAGNOSIS — Z20828 Contact with and (suspected) exposure to other viral communicable diseases: Secondary | ICD-10-CM | POA: Insufficient documentation

## 2019-01-15 DIAGNOSIS — R0602 Shortness of breath: Secondary | ICD-10-CM | POA: Insufficient documentation

## 2019-01-15 DIAGNOSIS — R0789 Other chest pain: Secondary | ICD-10-CM | POA: Insufficient documentation

## 2019-01-15 LAB — CBC WITH DIFFERENTIAL/PLATELET
Abs Immature Granulocytes: 0.03 10*3/uL (ref 0.00–0.07)
Basophils Absolute: 0 10*3/uL (ref 0.0–0.1)
Basophils Relative: 0 %
Eosinophils Absolute: 0.5 10*3/uL (ref 0.0–0.5)
Eosinophils Relative: 5 %
HCT: 45.5 % (ref 36.0–46.0)
Hemoglobin: 14.3 g/dL (ref 12.0–15.0)
Immature Granulocytes: 0 %
Lymphocytes Relative: 32 %
Lymphs Abs: 3.2 10*3/uL (ref 0.7–4.0)
MCH: 25.8 pg — ABNORMAL LOW (ref 26.0–34.0)
MCHC: 31.4 g/dL (ref 30.0–36.0)
MCV: 82.1 fL (ref 80.0–100.0)
Monocytes Absolute: 0.5 10*3/uL (ref 0.1–1.0)
Monocytes Relative: 5 %
Neutro Abs: 5.7 10*3/uL (ref 1.7–7.7)
Neutrophils Relative %: 58 %
Platelets: 343 10*3/uL (ref 150–400)
RBC: 5.54 MIL/uL — ABNORMAL HIGH (ref 3.87–5.11)
RDW: 14.4 % (ref 11.5–15.5)
WBC: 10 10*3/uL (ref 4.0–10.5)
nRBC: 0 % (ref 0.0–0.2)

## 2019-01-15 LAB — BASIC METABOLIC PANEL
Anion gap: 12 (ref 5–15)
BUN: 11 mg/dL (ref 6–20)
CO2: 22 mmol/L (ref 22–32)
Calcium: 9 mg/dL (ref 8.9–10.3)
Chloride: 105 mmol/L (ref 98–111)
Creatinine, Ser: 0.77 mg/dL (ref 0.44–1.00)
GFR calc Af Amer: >60 mL/min (ref >60)
GFR calc non Af Amer: >60 mL/min (ref >60)
Glucose, Bld: 96 mg/dL (ref 70–99)
Potassium: 3.3 mmol/L — ABNORMAL LOW (ref 3.5–5.1)
Sodium: 139 mmol/L (ref 135–145)

## 2019-01-15 LAB — D-DIMER, QUANTITATIVE: D-Dimer, Quant: 0.31 ug/mL-FEU (ref 0.00–0.50)

## 2019-01-15 LAB — TROPONIN I (HIGH SENSITIVITY): Troponin I (High Sensitivity): 2 ng/L (ref <18)

## 2019-01-15 LAB — PREGNANCY, URINE: Preg Test, Ur: NEGATIVE

## 2019-01-15 MED ORDER — PREDNISONE 20 MG PO TABS: 40.0000 mg | ORAL_TABLET | Freq: Every day | ORAL | 0 refills | Status: AC

## 2019-01-15 MED ORDER — ALBUTEROL SULFATE HFA 108 (90 BASE) MCG/ACT IN AERS
INHALATION_SPRAY | RESPIRATORY_TRACT | Status: AC
  Administered 2019-01-15: 16:00:00 6 via RESPIRATORY_TRACT
  Filled 2019-01-15: qty 6.7

## 2019-01-15 MED ORDER — PREDNISONE 20 MG PO TABS
40.0000 mg | ORAL_TABLET | Freq: Once | ORAL | Status: AC
  Administered 2019-01-15: 21:00:00 40 mg via ORAL
  Filled 2019-01-15: qty 2

## 2019-01-15 MED ORDER — ALBUTEROL SULFATE HFA 108 (90 BASE) MCG/ACT IN AERS: 2.0000 | INHALATION_SPRAY | RESPIRATORY_TRACT | 1 refills | Status: DC | PRN

## 2019-01-15 MED ORDER — POTASSIUM CHLORIDE CRYS ER 20 MEQ PO TBCR
40.0000 meq | EXTENDED_RELEASE_TABLET | Freq: Once | ORAL | Status: AC
  Administered 2019-01-15: 21:00:00 40 meq via ORAL
  Filled 2019-01-15: qty 2

## 2019-01-15 NOTE — ED Notes (Signed)
Pt unable to void at this time. 

## 2019-01-15 NOTE — ED Provider Notes (Signed)
[facility] EMERGENCY DEPARTMENT Provider Note   CSN: [id number] Arrival date & time: 01/15/19  1532    History   Chief Complaint Chief Complaint  Patient presents with  . Shortness of Breath    HPI Julie Finley is a 23 y.o. female with no significant personal past medical history.  No history of asthma, who presents today for evaluation of shortness of breath.  She reports that over the past 2 days she has had worsening generalized shortness of breath.  She states that at first she felt like it was an allergic reaction and took Benadryl however that did not improve her symptoms.  She denies any known sick contacts.  She denies headache, changes to smell or taste, abdominal pain nausea vomiting or diarrhea.  She reports that her chest generally feels tight.  She was given an inhaler in the department prior to my evaluation, she reports that after 2 puffs of that she felt better however still feels short of breath.     HPI  Past Medical History:  Diagnosis Date  . Anxiety   . Medical history non-contributory   . Pregnancy induced hypertension    had postpartum magnesium    Patient Active Problem List   Diagnosis Date Noted  . Anxiety   . Post term pregnancy at [redacted] weeks gestation 08/21/2016  . Periurethral laceration, delivered, current hospitalization 12/09/2014  . Vaginal delivery 12/07/2014  . Abnormal MSAFP (maternal serum alpha-fetoprotein), decreased   . Abnormal AFP3 test   . Abnormal fetal ultrasound     Past Surgical History:  Procedure Laterality Date  . NO PAST SURGERIES       OB History    Gravida  2   Para  1   Term  1   Preterm      AB      Living  1     SAB      TAB      Ectopic      Multiple  0   Live Births  1            Home Medications    Prior to Admission medications   Medication Sig Start Date End Date Taking? Authorizing Provider  albuterol (VENTOLIN HFA) 108 (90 Base) MCG/ACT inhaler Inhale 2 puffs  into the lungs every 4 (four) hours as needed for wheezing or shortness of breath. 01/15/19   Cristina GongHammond, Shoaib Siefker W, PA-C  cetirizine (ZYRTEC ALLERGY) 10 MG tablet Take 1 tablet (10 mg total) by mouth daily for 4 days. 06/20/17 06/24/17  Aviva KluverMurray, Alyssa B, PA-C  diphenhydrAMINE (BENADRYL) 25 MG tablet Take 50 mg by mouth daily as needed (ALLERGIC REACTION).    [provider]  famotidine (PEPCID) 20 MG tablet Take 1 tablet (20 mg total) by mouth 2 (two) times daily for 4 days. 06/20/17 06/24/17  Aviva KluverMurray, Alyssa B, PA-C  ibuprofen (ADVIL,MOTRIN) 600 MG tablet Take 1 tablet (600 mg total) by mouth every 6 (six) hours. Patient not taking: Reported on 06/20/2017 08/23/16   Montez MoritaLawson, Marie D, CNM  levonorgestrel [facility](MIRENA) 20 MCG/24HR IUD 1 each by Intrauterine route once.    [provider]  methocarbamol (ROBAXIN) 500 MG tablet Take 1 tablet (500 mg total) by mouth every 8 (eight) hours as needed for muscle spasms. 07/21/18   Petrucelli, Samantha R, PA-C  naproxen (NAPROSYN) 500 MG tablet Take 1 tablet (500 mg total) by mouth 2 (two) times daily. 07/21/18   Petrucelli, Samantha R, PA-C  predniSONE (DELTASONE)  20 MG tablet Take 2 tablets (40 mg total) by mouth daily for 2 days. 01/15/19 01/17/19  Lorin Glass, PA-C    Family History No family history on file.  Social History Social History   Tobacco Use  . Smoking status: Current Every Day Smoker    Packs/day: 0.50    Types: Cigarettes  . Smokeless tobacco: Never Used  Substance Use Topics  . Alcohol use: No  . Drug use: No     Allergies   Patient has no known allergies.   Review of Systems Review of Systems  Constitutional: Negative for chills and fever.  HENT: Negative for congestion.   Respiratory: Positive for cough, chest tightness and shortness of breath.   Cardiovascular: Negative for palpitations and leg swelling.  Gastrointestinal: Negative for abdominal pain, diarrhea, nausea and vomiting.  Musculoskeletal:  Negative for back pain and neck pain.  Neurological: Negative for weakness and headaches.  Psychiatric/Behavioral: Negative for confusion.  All other systems reviewed and are negative.    Physical Exam Updated Vital Signs BP 121/88 (BP Location: Right Arm)   Pulse 87   Temp 98.9 F (37.2 C) (Oral)   Resp (!) 36   Ht 5\' 5"  (1.651 m)   Wt 119.3 kg   SpO2 95%   BMI 43.77 kg/m   Physical Exam Vitals signs and nursing note reviewed.  Constitutional:      General: She is not in acute distress.    Appearance: She is well-developed. She is not diaphoretic.  HENT:     Head: Normocephalic and atraumatic.     Mouth/Throat:     Mouth: Mucous membranes are moist.  Eyes:     General: No scleral icterus.       Right eye: No discharge.        Left eye: No discharge.     Conjunctiva/sclera: Conjunctivae normal.  Neck:     Musculoskeletal: Normal range of motion.  Cardiovascular:     Rate and Rhythm: Normal rate and regular rhythm.  Pulmonary:     Effort: Pulmonary effort is normal. Tachypnea present. No accessory muscle usage or respiratory distress.     Breath sounds: No stridor. Examination of the right-upper field reveals wheezing. Examination of the left-upper field reveals wheezing. Examination of the right-middle field reveals wheezing. Examination of the left-middle field reveals wheezing. Wheezing (Mild, expiratory) present. No decreased breath sounds.  Chest:     Chest wall: No mass or tenderness.  Abdominal:     General: There is no distension.  Musculoskeletal:        General: No deformity.     Right lower leg: She exhibits no tenderness. No edema.     Left lower leg: She exhibits no tenderness. No edema.  Skin:    General: Skin is warm and dry.  Neurological:     General: No focal deficit present.     Mental Status: She is alert.     Motor: No abnormal muscle tone.  Psychiatric:        Mood and Affect: Mood normal.        Behavior: Behavior normal.    1645: RR  30  ED Treatments / Results  Labs (all labs ordered are listed, but only abnormal results are displayed) Labs Reviewed  CBC WITH DIFFERENTIAL/PLATELET - Abnormal; Notable for the following components:      Result Value   RBC 5.54 (*)    MCH 25.8 (*)    All other components within normal limits  BASIC METABOLIC PANEL - Abnormal; Notable for the following components:   Potassium 3.3 (*)    All other components within normal limits  NOVEL CORONAVIRUS, NAA (HOSPITAL ORDER, SEND-OUT TO REF LAB)  PREGNANCY, URINE  D-DIMER, QUANTITATIVE (NOT AT [facility])  TROPONIN I (HIGH SENSITIVITY)    EKG EKG Interpretation  Date/Time:  Monday January 15 2019 17:13:21 EDT Ventricular Rate:  90 PR Interval:    QRS Duration: 101 QT Interval:  279 QTC Calculation: 342 R Axis:   59 Text Interpretation:  Sinus rhythm Borderline T abnormalities, inferior leads No old tracing to compare Confirmed by Rolan BuccoBelfi, Melanie [phone]) on 01/15/2019 7:41:31 PM   Radiology Dg Chest Port 1 View  Result Date: 01/15/2019 CLINICAL DATA:  Wheezing and shortness of breath for the past few days. EXAM: PORTABLE CHEST 1 VIEW COMPARISON:  Chest x-ray dated March 05, 2018. FINDINGS: The heart size and mediastinal contours are within normal limits. Both lungs are clear. The visualized skeletal structures are unremarkable. IMPRESSION: No active disease. Electronically Signed   By: Obie DredgeWilliam T Derry M.D.   On: 01/15/2019 17:35    Procedures Procedures (including critical care time)  Medications Ordered in ED Medications  albuterol (VENTOLIN HFA) 108 (90 Base) MCG/ACT inhaler (6 puffs Inhalation Given 01/15/19 1603)  potassium chloride SA (K-DUR) CR tablet 40 mEq (40 mEq Oral Given 01/15/19 2125)  predniSONE (DELTASONE) tablet 40 mg (40 mg Oral Given 01/15/19 2125)     Initial Impression / Assessment and Plan / ED Course  I have reviewed the triage vital signs and the nursing notes.  Pertinent labs & imaging results that were  available during my care of the patient were reviewed by me and considered in my medical decision making (see chart for details).  Clinical Course as of Jan 14 2125  Mon Jan 15, 2019  2117 RR of 20 while in room.    [EH]    Clinical Course User Index [EH] Cristina GongHammond, Jehad Bisono W, PA-C      Patient presents today for evaluation of shortness of breath and chest tightness.  She does not have a history of asthma, however has mild expiratory wheezes.  She was treated with albuterol after which she reported feeling slightly better however still short of breath and remained tachypneic.    Unable to apply PERC criteria based on her tachypnea, and hormone use.  Plan to obtain labs, CXR, EKG.   Labs obtained and reviewed, BMP shows mild hypokalemia at 3.3, p.o. replacement was ordered however suspect that this may be transient and secondary to albuterol.  She does not have other significant hematologic or electrolyte derangements.  High-sensitivity troponin is 2.  D-dimer is not elevated at 0.31, pregnancy test was negative, EKG without evidence of acute ischemia or other cause for her symptoms.  Chest x-ray without evidence of consolidation, pneumothorax, or other cause for her symptoms.    As she improved with albuterol I suspect that there may be a reactive airway exacerbation component.  She is given prednisone while in the emergency room along with a prescription for short dose of prednisone at home.  She was able to ambulate in the room without difficulty maintaining oxygen sats over 94%.  Given the setting of a current global pandemic will obtain consent for outpatient coronavirus testing.  She is instructed that she needs to assume she is positive and quarantine herself appropriately.  Julie CoilMegan Kudo was evaluated in Emergency Department on 01/15/2019 for the symptoms described in the history of present illness.  She was evaluated in the context of the global COVID-19 pandemic, which necessitated  consideration that the patient might be at risk for infection with the SARS-CoV-2 virus that causes COVID-19. Institutional protocols and algorithms that pertain to the evaluation of patients at risk for COVID-19 are in a state of rapid change based on information released by regulatory bodies including the CDC and federal and state organizations. These policies and algorithms were followed during the patient's care in the ED.   Return precautions were discussed with patient who states their understanding.  At the time of discharge patient denied any unaddressed complaints or concerns.  Patient is agreeable for discharge home.   Final Clinical Impressions(s) / ED Diagnoses   Final diagnoses:  Shortness of breath    ED Discharge Orders         Ordered    albuterol (VENTOLIN HFA) 108 (90 Base) MCG/ACT inhaler  Every 4 hours PRN     01/15/19 2122    predniSONE (DELTASONE) 20 MG tablet  Daily     01/15/19 2122           Cristina GongHammond, Kiante Petrovich W, Cordelia Poche-C 01/15/19 2126    Rolan BuccoBelfi, Melanie, MD 01/15/19 2244

## 2019-01-15 NOTE — Discharge Instructions (Addendum)
I have given you a prescription for steroids today.  Some common side effects include feelings of extra energy, feeling warm, increased appetite, and stomach upset.  If you are diabetic your sugars may run higher than usual.  Please do not start the prednisone (steroid) until tomorrow night as you were given a dose in the ER and it lasts for 24 hours.   Your potassium was slightly low today.  This may be due to the albuterol inhaler.  Please make sure you area eating high potassium foods over the next few days.    You have a outpatient coronavirus test ordered.  Please follow your results in my chart.  Until the results come back you need to assume you have coronavirus and quarantine yourself at home.

## 2019-01-15 NOTE — ED Notes (Signed)
ED Provider at bedside. 

## 2019-01-15 NOTE — ED Notes (Signed)
Patient able to ambulate within room without desaturation of SPO2. SPO2 never below 94%.

## 2019-01-15 NOTE — ED Triage Notes (Signed)
Pt reports increased exertional shortness of breath over past few days.  Denies hx asthma.  Denies fever, denies n/v//d

## 2019-01-19 LAB — NOVEL CORONAVIRUS, NAA (HOSP ORDER, SEND-OUT TO REF LAB; TAT 18-24 HRS): SARS-CoV-2, NAA: NOT DETECTED

## 2019-06-25 ENCOUNTER — Other Ambulatory Visit: Payer: Self-pay

## 2019-06-25 ENCOUNTER — Emergency Department (HOSPITAL_BASED_OUTPATIENT_CLINIC_OR_DEPARTMENT_OTHER)
Admission: EM | Admit: 2019-06-25 | Discharge: 2019-06-25 | Disposition: A | Discharge status: Home / Self Care | Payer: Self-pay | Attending: Emergency Medicine | Admitting: Emergency Medicine

## 2019-06-25 ENCOUNTER — Encounter (HOSPITAL_BASED_OUTPATIENT_CLINIC_OR_DEPARTMENT_OTHER): Payer: Self-pay | Admitting: *Deleted

## 2019-06-25 ENCOUNTER — Emergency Department (HOSPITAL_BASED_OUTPATIENT_CLINIC_OR_DEPARTMENT_OTHER): Payer: Self-pay

## 2019-06-25 DIAGNOSIS — R05 Cough: Secondary | ICD-10-CM | POA: Insufficient documentation

## 2019-06-25 DIAGNOSIS — R059 Cough, unspecified: Secondary | ICD-10-CM

## 2019-06-25 DIAGNOSIS — F17210 Nicotine dependence, cigarettes, uncomplicated: Secondary | ICD-10-CM | POA: Insufficient documentation

## 2019-06-25 DIAGNOSIS — Z20828 Contact with and (suspected) exposure to other viral communicable diseases: Secondary | ICD-10-CM | POA: Insufficient documentation

## 2019-06-25 NOTE — ED Triage Notes (Signed)
Cough x 3 weeks. Pain after coughing. Pain under her left breast.

## 2019-06-25 NOTE — Discharge Instructions (Addendum)
You were evaluated in the Emergency Department and after careful evaluation, we did not find any emergent condition requiring admission or further testing in the hospital.  Your exam/testing today is overall reassuring.  Your symptoms seem to be due to a viral illness, possibly coronavirus.  We have tested you for the coronavirus here in the Emergency Department.  Please isolate or quarantine at home until you receive a negative test result.  If positive, we recommend continued home quarantine per CDC recommendations.   Please return to the Emergency Department if you experience any worsening of your condition.  We encourage you to follow up with a primary care provider.  Thank you for allowing us to be a part of your care. 

## 2019-06-25 NOTE — ED Provider Notes (Signed)
[facility]-EMERGENCY DEPT [facility] Emergency Department Provider Note MRN:  [id number]  Arrival date & time: 06/25/19     Chief Complaint   Cough   History of Present Illness   Julie Finley is a 23 y.o. year-old female with a history of anxiety presenting to the ED with chief complaint of cough.  3 weeks of cough, dry cough.  Patient explains that when the cough initially started she had some tenderness and pain to her left lateral ribs that was worse with coughing.  This pain went away but then recently returned a few days ago.  Denies shortness of breath, no anterior chest pain, no fever, no vomiting, no diarrhea, no abdominal pain.  Symptoms mild, constant, no other exacerbating or alleviating factors.  Review of Systems  A complete 10 system review of systems was obtained and all systems are negative except as noted in the HPI and PMH.   Patient's Health History    Past Medical History:  Diagnosis Date  . Anxiety   . Medical history non-contributory   . Pregnancy induced hypertension    had postpartum magnesium    Past Surgical History:  Procedure Laterality Date  . NO PAST SURGERIES      No family history on file.  Social History   Socioeconomic History  . Marital status: Single    Spouse name: Not on file  . Number of children: Not on file  . Years of education: Not on file  . Highest education level: Not on file  Occupational History  . Not on file  Tobacco Use  . Smoking status: Current Every Day Smoker    Packs/day: 0.50    Types: Cigarettes  . Smokeless tobacco: Never Used  Substance and Sexual Activity  . Alcohol use: No  . Drug use: No  . Sexual activity: Yes    Birth control/protection: None  Other Topics Concern  . Not on file  Social History Narrative  . Not on file   Social Determinants of Health   Financial Resource Strain:   . Difficulty of Paying Living Expenses: Not on file  Food Insecurity:   . Worried About Brewing technologist in the Last Year: Not on file  . Ran Out of Food in the Last Year: Not on file  Transportation Needs:   . Lack of Transportation (Medical): Not on file  . Lack of Transportation (Non-Medical): Not on file  Physical Activity:   . Days of Exercise per Week: Not on file  . Minutes of Exercise per Session: Not on file  Stress:   . Feeling of Stress : Not on file  Social Connections:   . Frequency of Communication with Friends and Family: Not on file  . Frequency of Social Gatherings with Friends and Family: Not on file  . Attends Religious Services: Not on file  . Active Member of Clubs or Organizations: Not on file  . Attends Banker Meetings: Not on file  . Marital Status: Not on file  Intimate Partner Violence:   . Fear of Current or Ex-Partner: Not on file  . Emotionally Abused: Not on file  . Physically Abused: Not on file  . Sexually Abused: Not on file     Physical Exam  Vital Signs and Nursing Notes reviewed Vitals:   06/25/19 1731  BP: 130/72  Pulse: 66  Resp: 20  Temp: 99 F (37.2 C)  SpO2: 98%    CONSTITUTIONAL: Well-appearing, NAD NEURO:  Alert and  oriented x 3, no focal deficits EYES:  eyes equal and reactive ENT/NECK:  no LAD, no JVD CARDIO: Regular rate, well-perfused, normal S1 and S2 PULM:  CTAB no wheezing or rhonchi GI/GU:  normal bowel sounds, non-distended, non-tender MSK/SPINE:  No gross deformities, no edema; mild tenderness to palpation to the left lateral ribs SKIN:  no rash, atraumatic PSYCH:  Appropriate speech and behavior  Diagnostic and Interventional Summary    EKG Interpretation  Date/Time:    Ventricular Rate:    PR Interval:    QRS Duration:   QT Interval:    QTC Calculation:   R Axis:     Text Interpretation:        Labs Reviewed  NOVEL CORONAVIRUS, NAA (HOSP ORDER, SEND-OUT TO REF LAB; TAT 18-24 HRS)    XR Chest Single View  Final Result      Medications - No data to display   Procedures  /   Critical Care Procedures  ED Course and Medical Decision Making  I have reviewed the triage vital signs and the nursing notes.  Pertinent labs & imaging results that were available during my care of the patient were reviewed by me and considered in my medical decision making (see below for details).     Suspect pleurisy versus chest wall pain.  PERC negative, reassuring vital signs, little to no concern for pulmonary embolism.  Chest x-ray to exclude pneumonia or pneumothorax, anticipating discharge.  8:30 PM update: Chest x-ray is unremarkable, appropriate for discharge.  Julie Finley was evaluated in Emergency Department on 06/25/2019 for the symptoms described in the history of present illness. She was evaluated in the context of the global COVID-19 pandemic, which necessitated consideration that the patient might be at risk for infection with the SARS-CoV-2 virus that causes COVID-19. Institutional protocols and algorithms that pertain to the evaluation of patients at risk for COVID-19 are in a state of rapid change based on information released by regulatory bodies including the CDC and federal and state organizations. These policies and algorithms were followed during the patient's care in the ED.   Barth Kirks. Sedonia Small, MD [place] [email or web address]  Final Clinical Impressions(s) / ED Diagnoses     ICD-10-CM   1. Cough  R05 XR Chest Single View    XR Chest Single View    ED Discharge Orders    None       Discharge Instructions Discussed with and Provided to Patient:     Discharge Instructions     You were evaluated in the Emergency Department and after careful evaluation, we did not find any emergent condition requiring admission or further testing in the hospital.  Your exam/testing today is overall reassuring.  Your symptoms seem to be due to a viral illness, possibly coronavirus.  We have tested you for the coronavirus  here in the Emergency Department.  Please isolate or quarantine at home until you receive a negative test result.  If positive, we recommend continued home quarantine per [facility] recommendations.   Please return to the Emergency Department if you experience any worsening of your condition.  We encourage you to follow up with a primary care provider.  Thank you for allowing [place] to be a part of your care.       Maudie Flakes, MD 06/25/19 2030

## 2019-06-27 LAB — NOVEL CORONAVIRUS, NAA (HOSP ORDER, SEND-OUT TO REF LAB; TAT 18-24 HRS): SARS-CoV-2, NAA: NOT DETECTED

## 2019-11-27 ENCOUNTER — Other Ambulatory Visit: Payer: Self-pay

## 2019-11-27 ENCOUNTER — Emergency Department (HOSPITAL_BASED_OUTPATIENT_CLINIC_OR_DEPARTMENT_OTHER)
Admission: EM | Admit: 2019-11-27 | Discharge: 2019-11-27 | Disposition: A | Discharge status: Home / Self Care | Payer: HRSA Program | Attending: Emergency Medicine | Admitting: Emergency Medicine

## 2019-11-27 ENCOUNTER — Encounter (HOSPITAL_BASED_OUTPATIENT_CLINIC_OR_DEPARTMENT_OTHER): Payer: Self-pay

## 2019-11-27 DIAGNOSIS — F17210 Nicotine dependence, cigarettes, uncomplicated: Secondary | ICD-10-CM | POA: Diagnosis not present

## 2019-11-27 DIAGNOSIS — R0981 Nasal congestion: Secondary | ICD-10-CM | POA: Insufficient documentation

## 2019-11-27 DIAGNOSIS — R05 Cough: Secondary | ICD-10-CM | POA: Insufficient documentation

## 2019-11-27 DIAGNOSIS — Z20822 Contact with and (suspected) exposure to covid-19: Secondary | ICD-10-CM | POA: Diagnosis not present

## 2019-11-27 LAB — SARS CORONAVIRUS 2 (TAT 6-24 HRS): SARS Coronavirus 2: NEGATIVE

## 2019-11-27 NOTE — ED Notes (Signed)
Patient c/o cough and nasal congestion X 3 to 4 days.

## 2019-11-27 NOTE — ED Provider Notes (Signed)
[facility] EMERGENCY DEPARTMENT Provider Note   CSN: [id number] Arrival date & time: 11/27/19  0554     History Chief Complaint  Patient presents with  . Cough  . Nasal Congestion    Julie Finley is a 24 y.o. female.  Thinks that her nasal congestion and dry cough are from allergies but wants checked for covid because she has tried allergy medications and have not worked. No fever. 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    Cough Cough characteristics:  Dry Sputum characteristics:  Nondescript Severity:  Mild Onset quality:  Gradual Timing:  Intermittent Chronicity:  New Smoker: no   Relieved by:  None tried Worsened by:  Nothing      Past Medical History:  Diagnosis Date  . Anxiety   . Medical history non-contributory   . Pregnancy induced hypertension    had postpartum magnesium    Patient Active Problem List   Diagnosis Date Noted  . Anxiety   . Post term pregnancy at [redacted] weeks gestation 08/21/2016  . Periurethral laceration, delivered, current hospitalization 12/09/2014  . Vaginal delivery 12/07/2014  . Abnormal MSAFP (maternal serum alpha-fetoprotein), decreased   . Abnormal AFP3 test   . Abnormal fetal ultrasound     Past Surgical History:  Procedure Laterality Date  . NO PAST SURGERIES       OB History    Gravida  2   Para  1   Term  1   Preterm      AB      Living  1     SAB      TAB      Ectopic      Multiple  0   Live Births  1           No family history on file.  Social History   Tobacco Use  . Smoking status: Current Every Day Smoker    Packs/day: 0.50    Types: Cigarettes  . Smokeless tobacco: Never Used  Substance Use Topics  . Alcohol use: No  . Drug use: No    Home Medications Prior to Admission medications   Medication Sig Start Date End Date Taking? Authorizing Provider  albuterol (VENTOLIN HFA) 108 (90 Base) MCG/ACT inhaler Inhale 2 puffs into the lungs every 4 (four) hours as needed  for wheezing or shortness of breath. 01/15/19   Cristina Gong, PA-C  cetirizine (ZYRTEC ALLERGY) 10 MG tablet Take 1 tablet (10 mg total) by mouth daily for 4 days. 06/20/17 06/24/17  Aviva Kluver B, PA-C  diphenhydrAMINE (BENADRYL) 25 MG tablet Take 50 mg by mouth daily as needed (ALLERGIC REACTION).    [provider]  famotidine (PEPCID) 20 MG tablet Take 1 tablet (20 mg total) by mouth 2 (two) times daily for 4 days. 06/20/17 06/24/17  Aviva Kluver B, PA-C  ibuprofen (ADVIL,MOTRIN) 600 MG tablet Take 1 tablet (600 mg total) by mouth every 6 (six) hours. Patient not taking: Reported on 06/20/2017 08/23/16   Montez Morita, CNM  levonorgestrel [facility]) 20 MCG/24HR IUD 1 each by Intrauterine route once.    [provider]  methocarbamol (ROBAXIN) 500 MG tablet Take 1 tablet (500 mg total) by mouth every 8 (eight) hours as needed for muscle spasms. 07/21/18   Petrucelli, Samantha R, PA-C  naproxen (NAPROSYN) 500 MG tablet Take 1 tablet (500 mg total) by mouth 2 (two) times daily. 07/21/18   Petrucelli, Pleas Koch, PA-C    Allergies  Patient has no known allergies.  Review of Systems   Review of Systems  Respiratory: Positive for cough.   All other systems reviewed and are negative.   Physical Exam Updated Vital Signs BP (!) 152/97 (BP Location: Right Arm)   Pulse 60   Temp 98 F (36.7 C) (Oral)   Resp 16   Ht 5\' 5"  (1.651 m)   Wt 122.9 kg   LMP 11/05/2019 (Approximate)   SpO2 100%   BMI 45.10 kg/m   Physical Exam Vitals and nursing note reviewed.  Constitutional:      Appearance: She is well-developed.  HENT:     Head: Normocephalic and atraumatic.     Mouth/Throat:     Mouth: Mucous membranes are dry.     Pharynx: Oropharynx is clear.  Eyes:     Conjunctiva/sclera: Conjunctivae normal.  Cardiovascular:     Rate and Rhythm: Normal rate and regular rhythm.  Pulmonary:     Effort: No respiratory distress.     Breath sounds: No stridor.    Abdominal:     General: There is no distension.  Musculoskeletal:        General: No swelling or tenderness. Normal range of motion.     Cervical back: Normal range of motion.  Skin:    General: Skin is warm and dry.  Neurological:     General: No focal deficit present.     Mental Status: She is alert and oriented to person, place, and time.     Cranial Nerves: No cranial nerve deficit.     ED Results / Procedures / Treatments   Labs (all labs ordered are listed, but only abnormal results are displayed) Labs Reviewed  SARS CORONAVIRUS 2 (TAT 6-24 HRS)    EKG None  Radiology No results found.  Procedures Procedures (including critical care time)  Medications Ordered in ED Medications - No data to display  ED Course  I have reviewed the triage vital signs and the nursing notes.  Pertinent labs & imaging results that were available during my care of the patient were reviewed by me and considered in my medical decision making (see chart for details).    MDM Rules/Calculators/A&P                      covid test per request. Likely allergies vs other vial URI.   Final Clinical Impression(s) / ED Diagnoses Final diagnoses:  Nasal congestion    Rx / DC Orders ED Discharge Orders    None       Jagger Demonte, Corene Cornea, MD 11/27/19 [phone]

## 2020-06-28 NOTE — L&D Delivery Note (Signed)
OB/GYN Faculty Practice Delivery Note  Julie Finley is a 25 y.o. G3P3003 s/p SVD at [redacted]w[redacted]d. She was admitted for IOL due to gHTN.   ROM: 10h 32m with clear fluid GBS Status: Positive > PCN  Delivery Date/Time: 05/30/21 at 0646  Delivery: Called to room and patient was complete and pushing. Head delivered LOA. Tight nuchal cord present x1 and reduced at the perineum. Shoulder and body delivered in usual fashion. Infant with spontaneous cry, placed on mother's abdomen, dried and stimulated. Cord clamped x 2 after 30 second delay and cut by FOB under my direct supervision. Infant taken to warmer for additional stimulation. Cord blood drawn. Placenta delivered spontaneously with gentle cord traction. Fundus firm with massage and Pitocin. Labia, perineum, vagina, and cervix were inspected, and patient was found to have small bilateral labial lacerations that were hemostatic and not repaired.   Placenta: Intact, 3VC - sent to L&D Complications: None Lacerations: Bilateral labial  EBL: 75 cc Analgesia: Epidural   Infant: Viable female  APGARs 7 and 9  Julie Field, MD OB/GYN Fellow, Faculty Practice

## 2020-08-10 IMAGING — DX DG CHEST 1V PORT
1 series · 1 of 1 positions shown · non-contrast
Comparison: 01/15/2019

CLINICAL DATA: Cough for 3 weeks.  Left chest pain.  Smoker.

EXAM:
PORTABLE CHEST 1 VIEW

[chest ap]
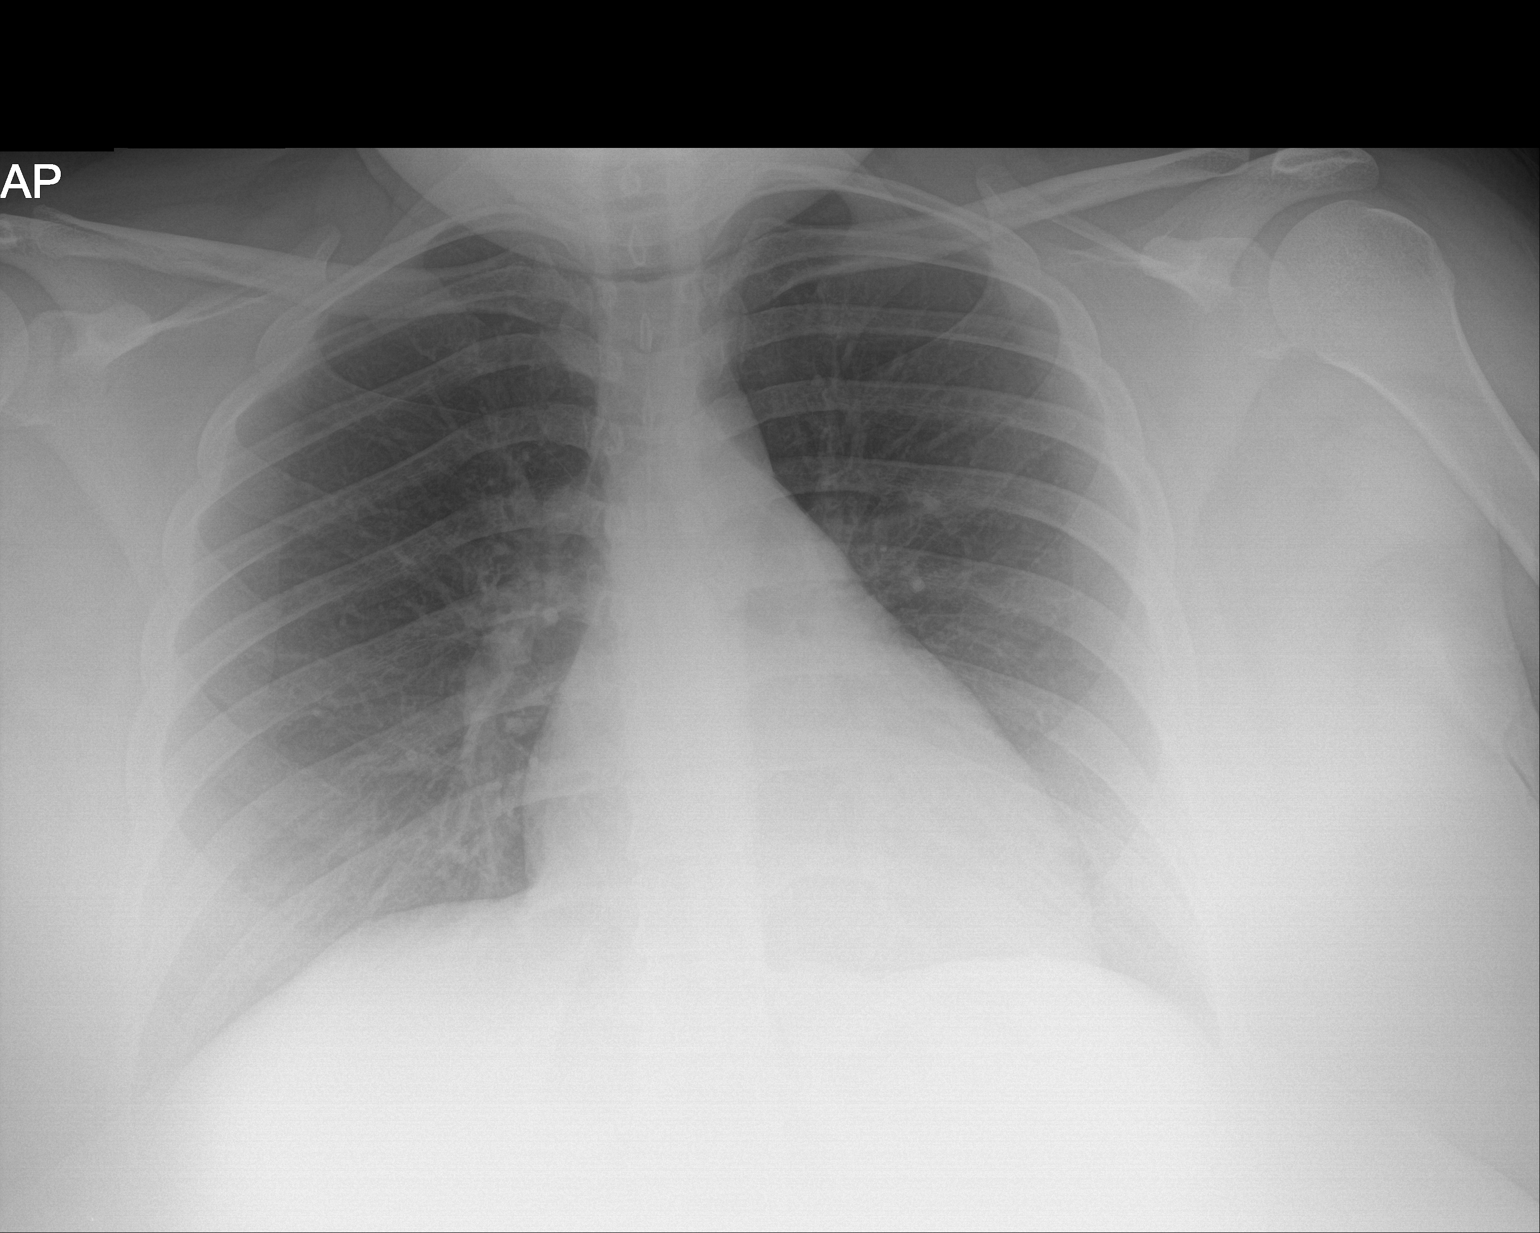

[1 of 1 positions shown; findings below may reference images not displayed]

FINDINGS: Midline trachea.  Normal heart size and mediastinal contours.

Sharp costophrenic angles.  No pneumothorax.  Clear lungs.

The Chin overlies the apices minimally.
IMPRESSION: No active disease.

## 2020-11-10 ENCOUNTER — Encounter (HOSPITAL_BASED_OUTPATIENT_CLINIC_OR_DEPARTMENT_OTHER): Payer: Self-pay

## 2020-11-10 ENCOUNTER — Emergency Department (HOSPITAL_BASED_OUTPATIENT_CLINIC_OR_DEPARTMENT_OTHER)
Admission: EM | Admit: 2020-11-10 | Discharge: 2020-11-10 | Disposition: A | Discharge status: Home / Self Care | Payer: Self-pay | Attending: Emergency Medicine | Admitting: Emergency Medicine

## 2020-11-10 ENCOUNTER — Other Ambulatory Visit: Payer: Self-pay

## 2020-11-10 DIAGNOSIS — M545 Low back pain, unspecified: Secondary | ICD-10-CM | POA: Insufficient documentation

## 2020-11-10 DIAGNOSIS — F17210 Nicotine dependence, cigarettes, uncomplicated: Secondary | ICD-10-CM | POA: Insufficient documentation

## 2020-11-10 DIAGNOSIS — O131 Gestational [pregnancy-induced] hypertension without significant proteinuria, first trimester: Secondary | ICD-10-CM | POA: Insufficient documentation

## 2020-11-10 DIAGNOSIS — O26891 Other specified pregnancy related conditions, first trimester: Secondary | ICD-10-CM | POA: Insufficient documentation

## 2020-11-10 DIAGNOSIS — Z3A11 11 weeks gestation of pregnancy: Secondary | ICD-10-CM | POA: Insufficient documentation

## 2020-11-10 DIAGNOSIS — R202 Paresthesia of skin: Secondary | ICD-10-CM | POA: Insufficient documentation

## 2020-11-10 DIAGNOSIS — O99331 Smoking (tobacco) complicating pregnancy, first trimester: Secondary | ICD-10-CM | POA: Insufficient documentation

## 2020-11-10 LAB — URINALYSIS, ROUTINE W REFLEX MICROSCOPIC
Bilirubin Urine: NEGATIVE
Glucose, UA: NEGATIVE mg/dL
Hgb urine dipstick: NEGATIVE
Ketones, ur: NEGATIVE mg/dL
Leukocytes,Ua: NEGATIVE
Nitrite: NEGATIVE
Protein, ur: NEGATIVE mg/dL
Specific Gravity, Urine: 1.02 (ref 1.005–1.030)
pH: 7.5 (ref 5.0–8.0)

## 2020-11-10 NOTE — ED Provider Notes (Signed)
[facility] EMERGENCY DEPARTMENT Provider Note   CSN: [id number] Arrival date & time: 11/10/20  1048     History Chief Complaint  Patient presents with  . Back Pain    Julie Finley is a 25 y.o. female.  HPI 25 year old female who is G3 P2 presents to the emergency department today for evaluation of low back pain.  Patient reports the pain began yesterday after work.  Pain radiates down her left leg.  Denies any loss of bowel or bladder, saddle paresthesias or urinary retention.  Does have some intermittent numbness and tingling.  Patient denies any vaginal bleeding or discharge.  No lower abdominal or pelvic pain.  Patient reports that she is approxi-[redacted] weeks pregnant.  Last menstrual cycle was February 16.  She has had a confirmatory hCG but has not had an ultrasound.  She has not followed up with OB/GYN. She has taken no meds prior to arrival.  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    Past Medical History:  Diagnosis Date  . Anxiety   . Medical history non-contributory   . Pregnancy induced hypertension    had postpartum magnesium    Patient Active Problem List   Diagnosis Date Noted  . Anxiety   . Post term pregnancy at [redacted] weeks gestation 08/21/2016  . Periurethral laceration, delivered, current hospitalization 12/09/2014  . Vaginal delivery 12/07/2014  . Abnormal MSAFP (maternal serum alpha-fetoprotein), decreased   . Abnormal AFP3 test   . Abnormal fetal ultrasound     Past Surgical History:  Procedure Laterality Date  . NO PAST SURGERIES       OB History    Gravida  2   Para  1   Term  1   Preterm      AB      Living  1     SAB      IAB      Ectopic      Multiple  0   Live Births  1           History reviewed. No pertinent family history.  Social History   Tobacco Use  . Smoking status: Current Every Day Smoker    Packs/day: 0.50    Types: Cigarettes  .  Smokeless tobacco: Never Used  Vaping Use  . Vaping Use: Never used  Substance Use Topics  . Alcohol use: No  . Drug use: No    Home Medications Prior to Admission medications   Medication Sig Start Date End Date Taking? Authorizing Provider  albuterol (VENTOLIN HFA) 108 (90 Base) MCG/ACT inhaler Inhale 2 puffs into the lungs every 4 (four) hours as needed for wheezing or shortness of breath. 01/15/19   Cristina Gong, PA-C  cetirizine (ZYRTEC ALLERGY) 10 MG tablet Take 1 tablet (10 mg total) by mouth daily for 4 days. 06/20/17 06/24/17  Aviva Kluver B, PA-C  diphenhydrAMINE (BENADRYL) 25 MG tablet Take 50 mg by mouth daily as needed (ALLERGIC REACTION).    [provider]  famotidine (PEPCID) 20 MG tablet Take 1 tablet (20 mg total) by mouth 2 (two) times daily for 4 days. 06/20/17 06/24/17  Aviva Kluver B, PA-C  ibuprofen (ADVIL,MOTRIN) 600 MG tablet Take 1 tablet (600 mg total) by mouth every 6 (six) hours. Patient not taking: No sig reported 08/23/16   Montez Morita, CNM  levonorgestrel [facility]) 20 MCG/24HR IUD 1 each by Intrauterine  route once.    [provider]  methocarbamol (ROBAXIN) 500 MG tablet Take 1 tablet (500 mg total) by mouth every 8 (eight) hours as needed for muscle spasms. 07/21/18   Petrucelli, Samantha R, PA-C  naproxen (NAPROSYN) 500 MG tablet Take 1 tablet (500 mg total) by mouth 2 (two) times daily. 07/21/18   Petrucelli, Pleas Koch, PA-C    Allergies    Patient has no known allergies.  Review of Systems   Review of Systems  Constitutional: Negative for chills and fever.  HENT: Negative for congestion.   Eyes: Negative for discharge.  Gastrointestinal: Negative for abdominal pain.  Genitourinary: Negative for pelvic pain, vaginal bleeding and vaginal discharge.  Musculoskeletal: Positive for back pain.  Skin: Negative for color change.  Neurological: Positive for numbness. Negative for weakness.  Psychiatric/Behavioral: Negative  for confusion.    Physical Exam Updated Vital Signs BP (!) 140/94 (BP Location: Right Arm)   Pulse 74   Temp 98.6 F (37 C) (Oral)   Resp 20   Ht 5\' 6"  (1.676 m)   Wt 115.2 kg   SpO2 100%   BMI 41.00 kg/m   Physical Exam Vitals and nursing note reviewed.  Constitutional:      General: She is not in acute distress.    Appearance: She is well-developed.  HENT:     Head: Normocephalic and atraumatic.  Eyes:     General: No scleral icterus.       Right eye: No discharge.        Left eye: No discharge.  Cardiovascular:     Pulses: Normal pulses.  Pulmonary:     Effort: No respiratory distress.  Abdominal:     General: Abdomen is flat. Bowel sounds are normal.     Palpations: Abdomen is soft.  Musculoskeletal:        General: Normal range of motion.     Cervical back: Normal range of motion.     Comments: Lumbar paraspinal tenderness negative straight leg raise test.  Strength out of 5 in lower extremities.  Skin:    Coloration: Skin is not pale.  Neurological:     Mental Status: She is alert.     Sensory: No sensory deficit.     Motor: No weakness.  Psychiatric:        Behavior: Behavior normal.        Thought Content: Thought content normal.        Judgment: Judgment normal.     ED Results / Procedures / Treatments   Labs (all labs ordered are listed, but only abnormal results are displayed) Labs Reviewed  URINALYSIS, ROUTINE W REFLEX MICROSCOPIC    EKG None  Radiology No results found.  Procedures Procedures   Medications Ordered in ED Medications - No data to display  ED Course  I have reviewed the triage vital signs and the nursing notes.  Pertinent labs & imaging results that were available during my care of the patient were reviewed by me and considered in my medical decision making (see chart for details).    MDM Rules/Calculators/A&P                          25 year old presents the ER for low back pain.  Patient is a proximal [redacted] weeks  pregnant based on her last menstrual cycle.  Not had a confirmatory ultrasound peer denies abdominal pain, vaginal bleeding or discharge.  Quick bedside ultrasound was performed that  shows IUP with normal heart tones.  Patient does have reproducible back pain.  This is likely musculoskeletal pain or sciatic nerve pain.  I do not feel that imaging is indicated as the risk outweigh the benefits.  Patient has no red flag symptoms concerning for cauda equina at this time.  Presentation not consistent with ectopic pregnancy, miscarriage.  Patient will need close outpatient OB/GYN follow-up.  Encouraged her to continue use of Tylenol at home along with warm compresses.  Encourage stretches.  Discussed reasons she should return to the ER.  Pt is hemodynamically stable, in NAD, & able to ambulate in the ED. Evaluation does not show pathology that would require ongoing emergent intervention or inpatient treatment. I explained the diagnosis to the patient. Pt has no complaints prior to dc. Pt is comfortable with above plan and is stable for discharge at this time. All questions were answered prior to disposition. Strict return precautions for f/u to the ED were discussed. Encouraged follow up with PCP.  Final Clinical Impression(s) / ED Diagnoses Final diagnoses:  Acute bilateral low back pain without sciatica    Rx / DC Orders ED Discharge Orders    None       Wallace Keller 11/10/20 1327    Terrilee Files, MD 11/10/20 [phone]

## 2020-11-10 NOTE — Discharge Instructions (Signed)
This is likely secondary to musculoskeletal pain.  Tylenol for pain.  Take the prenatal vitamins.  Warm compresses.  Perform the stretches as discussed.  Make sure to follow-up with an OB/GYN doctor.  If he develop any pelvic pain, abdominal pain, vaginal bleeding or discharge return to the ER.

## 2020-11-10 NOTE — ED Triage Notes (Signed)
Pt is G3P2 who presents at 11wks preg with c/o lower back pain since last PM. 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  PT noticed the pain radiate to L leg yesterday.

## 2021-01-26 LAB — OB RESULTS CONSOLE PLATELET COUNT: Platelets: 303

## 2021-01-26 LAB — HEPATITIS C ANTIBODY: HCV Ab: NEGATIVE

## 2021-01-26 LAB — OB RESULTS CONSOLE ABO/RH: RH Type: POSITIVE

## 2021-01-26 LAB — OB RESULTS CONSOLE HEPATITIS B SURFACE ANTIGEN: Hepatitis B Surface Ag: NEGATIVE

## 2021-01-26 LAB — OB RESULTS CONSOLE HGB/HCT, BLOOD
HCT: 37 (ref 29–41)
Hemoglobin: 11.8

## 2021-01-26 LAB — OB RESULTS CONSOLE ANTIBODY SCREEN: Antibody Screen: NEGATIVE

## 2021-01-26 LAB — OB RESULTS CONSOLE GC/CHLAMYDIA
Chlamydia: NEGATIVE
Gonorrhea: NEGATIVE

## 2021-01-26 LAB — URINE CULTURE
Glucose 1 Hour: 100
Urine Culture, OB: NEGATIVE

## 2021-01-26 LAB — OB RESULTS CONSOLE RUBELLA ANTIBODY, IGM: Rubella: NON-IMMUNE/NOT IMMUNE

## 2021-01-26 LAB — OB RESULTS CONSOLE HIV ANTIBODY (ROUTINE TESTING): HIV: NONREACTIVE

## 2021-01-26 LAB — OB RESULTS CONSOLE RPR: RPR: NONREACTIVE

## 2021-01-26 LAB — CYTOLOGY - PAP: Pap: NEGATIVE

## 2021-02-07 ENCOUNTER — Encounter (HOSPITAL_BASED_OUTPATIENT_CLINIC_OR_DEPARTMENT_OTHER): Payer: Self-pay | Admitting: Emergency Medicine

## 2021-02-07 ENCOUNTER — Emergency Department (HOSPITAL_BASED_OUTPATIENT_CLINIC_OR_DEPARTMENT_OTHER)
Admission: EM | Admit: 2021-02-07 | Discharge: 2021-02-07 | Disposition: A | Discharge status: Home / Self Care | Payer: Medicaid Other | Attending: Emergency Medicine | Admitting: Emergency Medicine

## 2021-02-07 ENCOUNTER — Other Ambulatory Visit: Payer: Self-pay

## 2021-02-07 DIAGNOSIS — Z3A21 21 weeks gestation of pregnancy: Secondary | ICD-10-CM | POA: Insufficient documentation

## 2021-02-07 DIAGNOSIS — O98512 Other viral diseases complicating pregnancy, second trimester: Secondary | ICD-10-CM | POA: Insufficient documentation

## 2021-02-07 DIAGNOSIS — Z87891 Personal history of nicotine dependence: Secondary | ICD-10-CM | POA: Diagnosis not present

## 2021-02-07 DIAGNOSIS — U071 COVID-19: Secondary | ICD-10-CM | POA: Insufficient documentation

## 2021-02-07 DIAGNOSIS — Z28310 Unvaccinated for covid-19: Secondary | ICD-10-CM | POA: Diagnosis not present

## 2021-02-07 LAB — RESP PANEL BY RT-PCR (FLU A&B, COVID) ARPGX2
Influenza A by PCR: NEGATIVE
Influenza B by PCR: NEGATIVE
SARS Coronavirus 2 by RT PCR: POSITIVE — AB

## 2021-02-07 MED ORDER — PAXLOVID 10 X 150 MG & 10 X 100MG PO TBPK: 2.0000 | ORAL_TABLET | Freq: Two times a day (BID) | ORAL | 0 refills | Status: AC

## 2021-02-07 MED ORDER — ACETAMINOPHEN 325 MG PO TABS
650.0000 mg | ORAL_TABLET | Freq: Once | ORAL | Status: AC
  Administered 2021-02-07: 650 mg via ORAL
  Filled 2021-02-07: qty 2

## 2021-02-07 NOTE — ED Triage Notes (Signed)
Pt arrives pov with c/o HA, fever, cough, congestion with chills x 2 days. Pt reports [redacted] weeks pregnant. Endorses fetal movement, denies pregnancy issues

## 2021-02-07 NOTE — ED Provider Notes (Signed)
[facility] EMERGENCY DEPARTMENT Provider Note   CSN: [id number] Arrival date & time: 02/07/21  1009     History Chief Complaint  Patient presents with   Cough    Julie Finley is a 25 y.o. female presenting for evaluation of cough and congestion.  Patient states her symptoms began yesterday.  She reports nonproductive cough and nasal congestion.  Associated headache.  No fevers.  No chest pain, shortness of breath, nausea, vomiting abdominal pain, vaginal bleeding or vaginal discharge.  She is [redacted] weeks pregnant, states her OB/GYN is monitoring her blood pressure, however she has not been diagnosed with preeclampsia nor she on any medications for blood pressure.  She has no other medical problems.  She is not vaccinated for COVID.  She reports her daughter had a runny nose a few days ago and attends daycare.  HPI     Past Medical History:  Diagnosis Date   Anxiety    Medical history non-contributory    Pregnancy induced hypertension    had postpartum magnesium    Patient Active Problem List   Diagnosis Date Noted   Anxiety    Post term pregnancy at [redacted] weeks gestation 08/21/2016   Periurethral laceration, delivered, current hospitalization 12/09/2014   Vaginal delivery 12/07/2014   Abnormal MSAFP (maternal serum alpha-fetoprotein), decreased    Abnormal AFP3 test    Abnormal fetal ultrasound     Past Surgical History:  Procedure Laterality Date   NO PAST SURGERIES       OB History     Gravida  3   Para  1   Term  1   Preterm      AB      Living  1      SAB      IAB      Ectopic      Multiple  0   Live Births  1           History reviewed. No pertinent family history.  Social History   Tobacco Use   Smoking status: Former    Packs/day: 0.50    Types: Cigarettes   Smokeless tobacco: Never  Vaping Use   Vaping Use: Never used  Substance Use Topics   Alcohol use: Not Currently   Drug use: No    Home Medications Prior  to Admission medications   Medication Sig Start Date End Date Taking? Authorizing Provider  nirmatrelvir/ritonavir EUA, renal dosing, (PAXLOVID) TBPK Take 2 tablets by mouth 2 (two) times daily for 5 days. Take nirmatrelvir (150 mg) one tablet twice daily for 5 days and ritonavir (100 mg) one tablet twice daily for 5 days. 02/07/21 02/12/21 Yes Maksymilian Mabey, PA-C  albuterol (VENTOLIN HFA) 108 (90 Base) MCG/ACT inhaler Inhale 2 puffs into the lungs every 4 (four) hours as needed for wheezing or shortness of breath. 01/15/19   Cristina Gong, PA-C  cetirizine (ZYRTEC ALLERGY) 10 MG tablet Take 1 tablet (10 mg total) by mouth daily for 4 days. 06/20/17 06/24/17  Aviva Kluver B, PA-C  diphenhydrAMINE (BENADRYL) 25 MG tablet Take 50 mg by mouth daily as needed (ALLERGIC REACTION).    [provider]  famotidine (PEPCID) 20 MG tablet Take 1 tablet (20 mg total) by mouth 2 (two) times daily for 4 days. 06/20/17 06/24/17  Aviva Kluver B, PA-C  ibuprofen (ADVIL,MOTRIN) 600 MG tablet Take 1 tablet (600 mg total) by mouth every 6 (six) hours. Patient not taking: No sig reported 08/23/16  Montez Morita, CNM  levonorgestrel (MIRENA) 20 MCG/24HR IUD 1 each by Intrauterine route once.    [provider]  methocarbamol (ROBAXIN) 500 MG tablet Take 1 tablet (500 mg total) by mouth every 8 (eight) hours as needed for muscle spasms. 07/21/18   Petrucelli, Samantha R, PA-C  naproxen (NAPROSYN) 500 MG tablet Take 1 tablet (500 mg total) by mouth 2 (two) times daily. 07/21/18   Petrucelli, Pleas Koch, PA-C    Allergies    Patient has no known allergies.  Review of Systems   Review of Systems  HENT:  Positive for congestion.   Respiratory:  Positive for cough.   All other systems reviewed and are negative.  Physical Exam Updated Vital Signs BP (!) 112/56 (BP Location: Right Arm)   Pulse 85   Temp 99.4 F (37.4 C) (Oral)   Resp 18   Ht 5\' 6"  (1.676 m)   Wt 113.4 kg   LMP  09/10/2020   SpO2 100%   BMI 40.35 kg/m   Physical Exam Vitals and nursing note reviewed.  Constitutional:      General: She is not in acute distress.    Appearance: Normal appearance.     Comments: Resting in the bed in NAD  HENT:     Head: Normocephalic and atraumatic.     Mouth/Throat:     Pharynx: Uvula midline. Posterior oropharyngeal erythema present. No oropharyngeal exudate.     Tonsils: No tonsillar exudate.  Eyes:     Conjunctiva/sclera: Conjunctivae normal.     Pupils: Pupils are equal, round, and reactive to light.  Cardiovascular:     Rate and Rhythm: Normal rate and regular rhythm.     Pulses: Normal pulses.  Pulmonary:     Effort: Pulmonary effort is normal. No respiratory distress.     Breath sounds: Normal breath sounds. No wheezing.     Comments: Speaking in full sentences.  Clear lung sounds in all fields. Abdominal:     General: There is no distension.     Palpations: Abdomen is soft. There is no mass.     Tenderness: There is no abdominal tenderness. There is no guarding or rebound.  Musculoskeletal:        General: Normal range of motion.     Cervical back: Normal range of motion and neck supple.  Skin:    General: Skin is warm and dry.     Capillary Refill: Capillary refill takes less than 2 seconds.  Neurological:     Mental Status: She is alert and oriented to person, place, and time.  Psychiatric:        Mood and Affect: Mood and affect normal.        Speech: Speech normal.        Behavior: Behavior normal.    ED Results / Procedures / Treatments   Labs (all labs ordered are listed, but only abnormal results are displayed) Labs Reviewed  RESP PANEL BY RT-PCR (FLU A&B, COVID) ARPGX2 - Abnormal; Notable for the following components:      Result Value   SARS Coronavirus 2 by RT PCR POSITIVE (*)    All other components within normal limits    EKG None  Radiology No results found.  Procedures Procedures   Medications Ordered in  ED Medications  acetaminophen (TYLENOL) tablet 650 mg (650 mg Oral Given 02/07/21 1049)    ED Course  I have reviewed the triage vital signs and the nursing notes.  Pertinent labs &  imaging results that were available during my care of the patient were reviewed by me and considered in my medical decision making (see chart for details).    MDM Rules/Calculators/A&P                           Patient presenting with URI symptoms.  Physical exam reassuring, patient is afebrile and appears nontoxic.  Pulmonary exam reassuring.  Doubt pneumonia, strep, other bacterial infection, or peritonsillar abscess.  Likely viral URI, consider covid. As pt is higher risk due to pregnancy will wait for covid to result prior to plan/dispo.   COVID test is positive.  This is likely the cause of patient's symptoms.  I discussed with patient.  As patient is pregnant, she is considered high risk and therefore eligible for Paxlovid.  I discussed option of Paxil but, patient would like to do this.  I encouraged continued symptomatic management as well with hydration, Tylenol, cough drops.  Encourage follow-up with OB/GYN as needed.  Discussed strict return precautions including signs of worsening respiratory status.  At this time, patient appears safe for discharge.  Return precautions given.  Patient states she understands and agrees to plan.     Julie Finley was evaluated in Emergency Department on 02/07/2021 for the symptoms described in the history of present illness. She was evaluated in the context of the global COVID-19 pandemic, which necessitated consideration that the patient might be at risk for infection with the SARS-CoV-2 virus that causes COVID-19. Institutional protocols and algorithms that pertain to the evaluation of patients at risk for COVID-19 are in a state of rapid change based on information released by regulatory bodies including the CDC and federal and state organizations. These policies and  algorithms were followed during the patient's care in the ED.   Final Clinical Impression(s) / ED Diagnoses Final diagnoses:  COVID-19    Rx / DC Orders ED Discharge Orders          Ordered    nirmatrelvir/ritonavir EUA, renal dosing, (PAXLOVID) TBPK  2 times daily        02/07/21 1209             Alveria Apley, PA-C 02/07/21 1217    Long, Arlyss Repress, MD 02/08/21 [phone]

## 2021-02-07 NOTE — Discharge Instructions (Signed)
Your COVID test came back positive, this is the cause of your symptoms. Take Paxilovid as prescribed to decrease risk of breathing problems and needing to be hospitalized. Otherwise, treat symptomatically.  Use Tylenol as needed for fever or headaches. Make sure staying well-hydrated water. Use cough drops as needed for cough. Return to the emergency room if you develop difficulty breathing or any new, worsening, or concerning symptoms.

## 2021-02-11 ENCOUNTER — Ambulatory Visit: Payer: No Typology Code available for payment source | Attending: Family Medicine

## 2021-02-17 ENCOUNTER — Telehealth: Payer: Self-pay

## 2021-02-17 NOTE — Telephone Encounter (Signed)
Attempt to call patient with Genetic Counseling would not go through, so I sent her a patient message through My Chart to make patient aware of appointment scheduled for Thursday 02/26/21 to arrive at 2:45pm

## 2021-02-25 ENCOUNTER — Inpatient Hospital Stay (HOSPITAL_COMMUNITY): Payer: Medicaid Other

## 2021-02-25 ENCOUNTER — Other Ambulatory Visit: Payer: Self-pay

## 2021-02-25 ENCOUNTER — Inpatient Hospital Stay (HOSPITAL_COMMUNITY)
Admission: EM | Admit: 2021-02-25 | Discharge: 2021-02-25 | Disposition: A | Discharge status: Home / Self Care | Payer: Medicaid Other | Attending: Obstetrics & Gynecology | Admitting: Obstetrics & Gynecology

## 2021-02-25 ENCOUNTER — Encounter (HOSPITAL_COMMUNITY): Payer: Self-pay | Admitting: Obstetrics & Gynecology

## 2021-02-25 DIAGNOSIS — E860 Dehydration: Secondary | ICD-10-CM | POA: Diagnosis not present

## 2021-02-25 DIAGNOSIS — D72829 Elevated white blood cell count, unspecified: Secondary | ICD-10-CM

## 2021-02-25 DIAGNOSIS — O99282 Endocrine, nutritional and metabolic diseases complicating pregnancy, second trimester: Secondary | ICD-10-CM | POA: Insufficient documentation

## 2021-02-25 DIAGNOSIS — O26892 Other specified pregnancy related conditions, second trimester: Secondary | ICD-10-CM | POA: Diagnosis present

## 2021-02-25 DIAGNOSIS — Z79899 Other long term (current) drug therapy: Secondary | ICD-10-CM | POA: Diagnosis not present

## 2021-02-25 DIAGNOSIS — O99112 Other diseases of the blood and blood-forming organs and certain disorders involving the immune mechanism complicating pregnancy, second trimester: Secondary | ICD-10-CM | POA: Insufficient documentation

## 2021-02-25 DIAGNOSIS — Z3A23 23 weeks gestation of pregnancy: Secondary | ICD-10-CM

## 2021-02-25 DIAGNOSIS — R109 Unspecified abdominal pain: Secondary | ICD-10-CM

## 2021-02-25 LAB — CBC WITH DIFFERENTIAL/PLATELET
Abs Immature Granulocytes: 0.03 10*3/uL (ref 0.00–0.07)
Basophils Absolute: 0 10*3/uL (ref 0.0–0.1)
Basophils Relative: 0 %
Eosinophils Absolute: 0.1 10*3/uL (ref 0.0–0.5)
Eosinophils Relative: 1 %
HCT: 36.1 % (ref 36.0–46.0)
Hemoglobin: 12 g/dL (ref 12.0–15.0)
Immature Granulocytes: 0 %
Lymphocytes Relative: 14 %
Lymphs Abs: 1.7 10*3/uL (ref 0.7–4.0)
MCH: 28.2 pg (ref 26.0–34.0)
MCHC: 33.2 g/dL (ref 30.0–36.0)
MCV: 84.9 fL (ref 80.0–100.0)
Monocytes Absolute: 0.6 10*3/uL (ref 0.1–1.0)
Monocytes Relative: 4 %
Neutro Abs: 10.4 10*3/uL — ABNORMAL HIGH (ref 1.7–7.7)
Neutrophils Relative %: 81 %
Platelets: 243 10*3/uL (ref 150–400)
RBC: 4.25 MIL/uL (ref 3.87–5.11)
RDW: 13.2 % (ref 11.5–15.5)
WBC: 12.9 10*3/uL — ABNORMAL HIGH (ref 4.0–10.5)
nRBC: 0 % (ref 0.0–0.2)

## 2021-02-25 LAB — COMPREHENSIVE METABOLIC PANEL
ALT: 14 U/L (ref 0–44)
AST: 15 U/L (ref 15–41)
Albumin: 3 g/dL — ABNORMAL LOW (ref 3.5–5.0)
Alkaline Phosphatase: 55 U/L (ref 38–126)
Anion gap: 7 (ref 5–15)
BUN: 6 mg/dL (ref 6–20)
CO2: 21 mmol/L — ABNORMAL LOW (ref 22–32)
Calcium: 8.9 mg/dL (ref 8.9–10.3)
Chloride: 107 mmol/L (ref 98–111)
Creatinine, Ser: 0.78 mg/dL (ref 0.44–1.00)
GFR, Estimated: >60 mL/min (ref >60)
Glucose, Bld: 91 mg/dL (ref 70–99)
Potassium: 3.6 mmol/L (ref 3.5–5.1)
Sodium: 135 mmol/L (ref 135–145)
Total Bilirubin: 0.3 mg/dL (ref 0.3–1.2)
Total Protein: 6 g/dL — ABNORMAL LOW (ref 6.5–8.1)

## 2021-02-25 LAB — URINALYSIS, ROUTINE W REFLEX MICROSCOPIC
Bilirubin Urine: NEGATIVE
Glucose, UA: NEGATIVE mg/dL
Hgb urine dipstick: NEGATIVE
Ketones, ur: 80 mg/dL — AB
Leukocytes,Ua: NEGATIVE
Nitrite: NEGATIVE
Protein, ur: NEGATIVE mg/dL
Specific Gravity, Urine: 1.018 (ref 1.005–1.030)
pH: 7 (ref 5.0–8.0)

## 2021-02-25 MED ORDER — LACTATED RINGERS IV BOLUS
1000.0000 mL | Freq: Once | INTRAVENOUS | Status: AC
  Administered 2021-02-25: 1000 mL via INTRAVENOUS

## 2021-02-25 MED ORDER — IBUPROFEN 600 MG PO TABS
600.0000 mg | ORAL_TABLET | Freq: Once | ORAL | Status: AC
  Administered 2021-02-25: 600 mg via ORAL
  Filled 2021-02-25: qty 1

## 2021-02-25 NOTE — ED Provider Notes (Signed)
Emergency Medicine Provider Triage Evaluation Note  Julie Finley , a 25 y.o. female  was evaluated in triage.  Pt complains of colicky flank pain, urinary frequency. No fever, nausea, vomiting, diarrhea. Patient is [redacted] weeks pregnant. G3P2 No issues with current pregnancy. Patient reports hx of pre-eclampsia with one of previous.  Review of Systems  Positive: As above Negative: As above  Physical Exam  BP 139/78 (BP Location: Right Arm)   Pulse 77   Temp 99.1 F (37.3 C) (Oral)   Resp 16   LMP 09/10/2020   SpO2 100%  Gen:   Awake, no distress   Resp:  Normal effort  MSK:   Moves extremities without difficulty  Other:    Medical Decision Making  Medically screening exam initiated at 2:38 PM.  Appropriate orders placed.  Doyce Stonehouse was informed that the remainder of the evaluation will be completed by another provider, this initial triage assessment does not replace that evaluation, and the importance of remaining in the ED until their evaluation is complete.  Flank pain, [redacted] weeks pregnant MAU agrees to accept patient   [place] 02/25/21 1441    Mancel Bale, MD 02/26/21 [phone]

## 2021-02-25 NOTE — MAU Note (Signed)
Pt reports right-sided back pain which wraps around to her front. This pain started at around 1030 today. She is also reporting urinary frequency today. She denies any pain with urination. Denies cntrx, vaginal discharge, or vag bleeding. +FM.

## 2021-02-25 NOTE — ED Triage Notes (Signed)
Pt complains of back pain that wraps around right lower abd. [redacted] weeks pregnant. Denies any bleeding. Pt has had a [facility]. Pt unsure if she had gush of fluid while urinating. But denies any more fluid. G3 P2

## 2021-02-25 NOTE — MAU Provider Note (Signed)
History     CSN: [id number]  Arrival date and time: 02/25/21 1358   Event Date/Time   First Provider Initiated Contact with Patient 02/25/21 1552      Chief Complaint  Patient presents with   Flank Pain   HPI  Julie Finley is a 25 y.o. female G3P2002 @ [redacted]w[redacted]d here in MAU with complaints of right flank pain.  The pain started  at 10:30 this Am,  pain started in Right flank, at first she thought baby was sitting on a nerve. Currently she rates her pain 7/10; she has not tried anything for the pain.  She had frequent UTI's with first pregnancy, thought it might be a UTI. NO dysuria.   + felal movement. No blood in urine.   OB History     Gravida  3   Para  2   Term  2   Preterm      AB      Living  2      SAB      IAB      Ectopic      Multiple  0   Live Births  2           Past Medical History:  Diagnosis Date   Anxiety    Medical history non-contributory    Pregnancy induced hypertension    had postpartum magnesium    Past Surgical History:  Procedure Laterality Date   NO PAST SURGERIES      History reviewed. No pertinent family history.  Social History   Tobacco Use   Smoking status: Former    Packs/day: 0.50    Types: Cigarettes   Smokeless tobacco: Never  Vaping Use   Vaping Use: Never used  Substance Use Topics   Alcohol use: Not Currently   Drug use: No    Allergies: No Known Allergies  Medications Prior to Admission  Medication Sig Dispense Refill Last Dose   Prenatal Vit-Fe Fumarate-FA (MULTIVITAMIN-PRENATAL) 27-0.8 MG TABS tablet Take 1 tablet by mouth daily at 12 noon.   02/24/2021   albuterol (VENTOLIN HFA) 108 (90 Base) MCG/ACT inhaler Inhale 2 puffs into the lungs every 4 (four) hours as needed for wheezing or shortness of breath. 8 g 1 More than a month   cetirizine (ZYRTEC ALLERGY) 10 MG tablet Take 1 tablet (10 mg total) by mouth daily for 4 days. 4 tablet 0    diphenhydrAMINE (BENADRYL) 25 MG tablet Take 50 mg  by mouth daily as needed (ALLERGIC REACTION).      famotidine (PEPCID) 20 MG tablet Take 1 tablet (20 mg total) by mouth 2 (two) times daily for 4 days. 8 tablet 0    ibuprofen (ADVIL,MOTRIN) 600 MG tablet Take 1 tablet (600 mg total) by mouth every 6 (six) hours. (Patient not taking: No sig reported) 30 tablet 0    levonorgestrel (MIRENA) 20 MCG/24HR IUD 1 each by Intrauterine route once.      methocarbamol (ROBAXIN) 500 MG tablet Take 1 tablet (500 mg total) by mouth every 8 (eight) hours as needed for muscle spasms. 15 tablet 0    naproxen (NAPROSYN) 500 MG tablet Take 1 tablet (500 mg total) by mouth 2 (two) times daily. 10 tablet 0    Results for orders placed or performed during the hospital encounter of 02/25/21 (from the past 48 hour(s))  Urinalysis, Routine w reflex microscopic Urine, Clean Catch     Status: Abnormal   Collection Time: 02/25/21  3:02 PM  Result Value Ref Range   Color, Urine YELLOW YELLOW   APPearance CLOUDY (A) CLEAR   Specific Gravity, Urine 1.018 1.005 - 1.030   pH 7.0 5.0 - 8.0   Glucose, UA NEGATIVE NEGATIVE mg/dL   Hgb urine dipstick NEGATIVE NEGATIVE   Bilirubin Urine NEGATIVE NEGATIVE   Ketones, ur 80 (A) NEGATIVE mg/dL   Protein, ur NEGATIVE NEGATIVE mg/dL   Nitrite NEGATIVE NEGATIVE   Leukocytes,Ua NEGATIVE NEGATIVE    Comment: Performed at [facility], [address], [place], [place] [address]  CBC with Differential/Platelet     Status: Abnormal   Collection Time: 02/25/21  3:42 PM  Result Value Ref Range   WBC 12.9 (H) 4.0 - 10.5 K/uL   RBC 4.25 3.87 - 5.11 MIL/uL   Hemoglobin 12.0 12.0 - 15.0 g/dL   HCT 67.1 24.5 - 80.9 %   MCV 84.9 80.0 - 100.0 fL   MCH 28.2 26.0 - 34.0 pg   MCHC 33.2 30.0 - 36.0 g/dL   RDW 98.3 38.2 - 50.5 %   Platelets 243 150 - 400 K/uL   nRBC 0.0 0.0 - 0.2 %   Neutrophils Relative % 81 %   Neutro Abs 10.4 (H) 1.7 - 7.7 K/uL   Lymphocytes Relative 14 %   Lymphs Abs 1.7 0.7 - 4.0 K/uL   Monocytes Relative 4 %    Monocytes Absolute 0.6 0.1 - 1.0 K/uL   Eosinophils Relative 1 %   Eosinophils Absolute 0.1 0.0 - 0.5 K/uL   Basophils Relative 0 %   Basophils Absolute 0.0 0.0 - 0.1 K/uL   Immature Granulocytes 0 %   Abs Immature Granulocytes 0.03 0.00 - 0.07 K/uL    Comment: Performed at [facility], [address], [place], [place] [address]  Comprehensive metabolic panel     Status: Abnormal   Collection Time: 02/25/21  3:42 PM  Result Value Ref Range   Sodium 135 135 - 145 mmol/L   Potassium 3.6 3.5 - 5.1 mmol/L   Chloride 107 98 - 111 mmol/L   CO2 21 (L) 22 - 32 mmol/L   Glucose, Bld 91 70 - 99 mg/dL    Comment: Glucose reference range applies only to samples taken after fasting for at least 8 hours.   BUN 6 6 - 20 mg/dL   Creatinine, Ser 3.41 0.44 - 1.00 mg/dL   Calcium 8.9 8.9 - 93.7 mg/dL   Total Protein 6.0 (L) 6.5 - 8.1 g/dL   Albumin 3.0 (L) 3.5 - 5.0 g/dL   AST 15 15 - 41 U/L   ALT 14 0 - 44 U/L   Alkaline Phosphatase 55 38 - 126 U/L   Total Bilirubin 0.3 0.3 - 1.2 mg/dL   GFR, Estimated >90 >24 mL/min    Comment: (NOTE) Calculated using the CKD-EPI Creatinine Equation (2021)    Anion gap 7 5 - 15    Comment: Performed at [facility], [address], [place], [place] [address]    Review of Systems  Constitutional:  Negative for fever.  Gastrointestinal:  Positive for nausea. Negative for diarrhea and vomiting.  Genitourinary:  Positive for difficulty urinating, flank pain, frequency and urgency. Negative for dysuria.  Physical Exam   Blood pressure 128/68, pulse 77, temperature 97.9 F (36.6 C), temperature source Oral, resp. rate 18, last menstrual period 09/10/2020, SpO2 100 %, unknown if currently breastfeeding.  Physical Exam Vitals and nursing note reviewed.  Constitutional:  Appearance: Normal appearance.  HENT:     Head: Normocephalic.  Eyes:     Pupils: Pupils are equal, round, and reactive to light.  Abdominal:     Tenderness: There is no  abdominal tenderness. There is no right CVA tenderness.  Skin:    General: Skin is warm.  Neurological:     Mental Status: She is alert and oriented to person, place, and time.  Psychiatric:        Behavior: Behavior normal.   Fetal Tracing: Baseline: 135 bpm Variability: Moderate  Accelerations: 10x10 Decelerations: None Toco: Quiet   MAU Course  Procedures None  MDM  Ibuprofen 600 mg given PO UA shows > 80 ketones.  LR bolus given. Patient urinated without difficulty Bladder scan showed 97 cc in bladder Pain now down to 0/10 Renal US without any acute findings; discussed this in detail with the patient.   Assessment and Plan   A:  1. Dehydration   2. Leukocytosis   3. Flank pain   4. [redacted] weeks gestation of pregnancy      P:  Discharge home in stable condition Return to MAU if symptoms worsen Ok to use tylenol or ibuprofen. No ibuprofen after 28 weeks Urine culture pending Increase oral fluid intake.    Duane Lope, NP 02/25/2021 7:41 PM

## 2021-02-26 ENCOUNTER — Ambulatory Visit: Payer: Self-pay | Attending: Family Medicine

## 2021-02-27 LAB — CULTURE, OB URINE

## 2021-05-04 ENCOUNTER — Encounter: Payer: Self-pay | Admitting: *Deleted

## 2021-05-04 DIAGNOSIS — O139 Gestational [pregnancy-induced] hypertension without significant proteinuria, unspecified trimester: Secondary | ICD-10-CM | POA: Insufficient documentation

## 2021-05-04 DIAGNOSIS — O0990 Supervision of high risk pregnancy, unspecified, unspecified trimester: Secondary | ICD-10-CM | POA: Insufficient documentation

## 2021-05-04 HISTORY — DX: Gestational (pregnancy-induced) hypertension without significant proteinuria, unspecified trimester: O13.9

## 2021-05-05 ENCOUNTER — Encounter: Payer: Self-pay | Admitting: Family Medicine

## 2021-05-05 ENCOUNTER — Other Ambulatory Visit: Payer: Self-pay

## 2021-05-05 ENCOUNTER — Ambulatory Visit (INDEPENDENT_AMBULATORY_CARE_PROVIDER_SITE_OTHER): Payer: Medicaid Other | Admitting: Family Medicine

## 2021-05-05 VITALS — BP 139/81 | HR 78 | Wt 254.4 lb

## 2021-05-05 DIAGNOSIS — O133 Gestational [pregnancy-induced] hypertension without significant proteinuria, third trimester: Secondary | ICD-10-CM | POA: Diagnosis not present

## 2021-05-05 DIAGNOSIS — O0990 Supervision of high risk pregnancy, unspecified, unspecified trimester: Secondary | ICD-10-CM

## 2021-05-05 DIAGNOSIS — F419 Anxiety disorder, unspecified: Secondary | ICD-10-CM | POA: Diagnosis not present

## 2021-05-05 NOTE — Progress Notes (Addendum)
Subjective:   Julie Finley is a 25 y.o. G3P2002 at [redacted]w[redacted]d by ultrasound being seen today for her first obstetrical visit.  Her obstetrical history is significant for pregnancy induced hypertension. Patient does intend to breast feed. Pregnancy history fully reviewed.  Patient reports no complaints.  Transfer from [facility] due to new diagnosis of gestational hypertension.   HISTORY: OB History  Gravida Para Term Preterm AB Living  3 2 2  0 0 2  SAB IAB Ectopic Multiple Live Births  0 0 0 0 2    # Outcome Date GA Lbr Len/2nd Weight Sex Delivery Anes PTL Lv  3 Current           2 Term 08/21/16 [redacted]w[redacted]d   F Vag-Spont   LIV  1 Term 12/07/14 [redacted]w[redacted]d 13:17 / 01:01 6 lb 11.4 oz (3.045 kg) M Vag-Spont EPI  LIV     Birth Comments: Preeclampsia/ GHTN, fetal huypospadius, pyelonephritis     Apgar1: 9  Apgar5: 9     Last pap smear: No results found for: DIAGPAP, HPV, [place] Normal 01/26/21  Past Medical History:  Diagnosis Date   Anxiety    Genital herpes    Medical history non-contributory    Pregnancy induced hypertension    had postpartum magnesium   Pyelonephritis 2016   Past Surgical History:  Procedure Laterality Date   NO PAST SURGERIES     History reviewed. No pertinent family history. Social History   Tobacco Use   Smoking status: Former    Packs/day: 0.50    Types: Cigarettes   Smokeless tobacco: Never  Vaping Use   Vaping Use: Never used  Substance Use Topics   Alcohol use: Not Currently   Drug use: No   No Known Allergies Current Outpatient Medications on File Prior to Visit  Medication Sig Dispense Refill   albuterol (VENTOLIN HFA) 108 (90 Base) MCG/ACT inhaler Inhale 2 puffs into the lungs every 4 (four) hours as needed for wheezing or shortness of breath. 8 g 1   diphenhydrAMINE (BENADRYL) 25 MG tablet Take 50 mg by mouth daily as needed (ALLERGIC REACTION).     Prenatal Vit-Fe Fumarate-FA (MULTIVITAMIN-PRENATAL) 27-0.8 MG TABS tablet Take 1 tablet by mouth  daily at 12 noon.     cetirizine (ZYRTEC ALLERGY) 10 MG tablet Take 1 tablet (10 mg total) by mouth daily for 4 days. 4 tablet 0   famotidine (PEPCID) 20 MG tablet Take 1 tablet (20 mg total) by mouth 2 (two) times daily for 4 days. 8 tablet 0   ibuprofen (ADVIL,MOTRIN) 600 MG tablet Take 1 tablet (600 mg total) by mouth every 6 (six) hours. (Patient not taking: No sig reported) 30 tablet 0   No current facility-administered medications on file prior to visit.     Exam   Vitals:   05/05/21 1456  BP: 139/81  Pulse: 78  Weight: 254 lb 6.4 oz (115.4 kg)   Fetal Heart Rate (bpm): 146  System: General: well-developed, well-nourished female in no acute distress   Skin: normal coloration and turgor, no rashes   Neurologic: oriented, normal, negative, normal mood   Extremities: normal strength, tone, and muscle mass, ROM of all joints is normal   HEENT PERRLA, extraocular movement intact and sclera clear, anicteric   Neck supple and no masses   Respiratory:  no respiratory distress      Assessment:   Pregnancy: [id number] Patient Active Problem List   Diagnosis Date Noted   Gestational hypertension 05/04/2021  Supervision of high-risk pregnancy 05/04/2021   Anxiety    Post term pregnancy at [redacted] weeks gestation 08/21/2016   Periurethral laceration, delivered, current hospitalization 12/09/2014   Vaginal delivery 12/07/2014   Abnormal MSAFP (maternal serum alpha-fetoprotein), decreased    Abnormal AFP3 test    Abnormal fetal ultrasound      Plan:  1. Supervision of high risk pregnancy, antepartum BP and FHR normal All prenatal labs reviewed, care up to date Dating appears to have been done by [redacted]w[redacted]d [place]. Report states EFW is <3%, however given discrepancy with LMP is 4 weeks, with adjusted dates EFW is 53% Will obtain new growth scan regardless - [place] MFM OB DETAIL +14 WK; Future  2. Gestational hypertension, third trimester BP normal today On ASA Needs growth and weekly BPP,  coordinated today Needs IOL at 37 weeks, patient understands Form faxed, IOL orders placed - [place] MFM OB DETAIL +14 WK; Future  3. Anxiety stable   Problem list reviewed and updated. The nature of [facility] with multiple MDs and other Advanced Practice Providers was explained to patient; also emphasized that residents, students are part of our team. Routine obstetric precautions reviewed. Return in 2 weeks (on 05/19/2021) for [facility], ob visit.

## 2021-05-05 NOTE — Patient Instructions (Signed)
Third Trimester of Pregnancy The third trimester of pregnancy is from week 28 through week 40. This is months 7 through 9. The third trimester is a time when the unborn baby (fetus) is growing rapidly. At the end of the ninth month, the fetus is about 20 inches long and weighs 6-10 pounds. Body changes during your third trimester During the third trimester, your body will continue to go through many changes. The changes vary and generally return to normal after your baby is born. Physical changes Your weight will continue to increase. You can expect to gain 25-35 pounds (11-16 kg) by the end of the pregnancy if you begin pregnancy at a normal weight. If you are underweight, you can expect to gain 28-40 lb (about 13-18 kg), and if you are overweight, you can expect to gain 15-25 lb (about 7-11 kg). You may begin to get stretch marks on your hips, abdomen, and breasts. Your breasts will continue to grow and may hurt. A yellow fluid (colostrum) may leak from your breasts. This is the first milk you are producing for your baby. You may have changes in your hair. These can include thickening of your hair, rapid growth, and changes in texture. Some people also have hair loss during or after pregnancy, or hair that feels dry or thin. Your belly button may stick out. You may notice more swelling in your hands, face, or ankles. Health changes You may have heartburn. You may have constipation. You may develop hemorrhoids. You may develop swollen, bulging veins (varicose veins) in your legs. You may have increased body aches in the pelvis, back, or thighs. This is due to weight gain and increased hormones that are relaxing your joints. You may have increased tingling or numbness in your hands, arms, and legs. The skin on your abdomen may also feel numb. You may feel short of breath because of your expanding uterus. Other changes You may urinate more often because the fetus is moving lower into your pelvis  and pressing on your bladder. You may have more problems sleeping. This may be caused by the size of your abdomen, an increased need to urinate, and an increase in your body's metabolism. You may notice the fetus "dropping," or moving lower in your abdomen (lightening). You may have increased vaginal discharge. You may notice that you have pain around your pelvic bone as your uterus distends. Follow these instructions at home: Medicines Follow your health care provider's instructions regarding medicine use. Specific medicines may be either safe or unsafe to take during pregnancy. Do not take any medicines unless approved by your health care provider. Take a prenatal vitamin that contains at least 600 micrograms (mcg) of folic acid. Eating and drinking Eat a healthy diet that includes fresh fruits and vegetables, whole grains, good sources of protein such as meat, eggs, or tofu, and low-fat dairy products. Avoid raw meat and unpasteurized juice, milk, and cheese. These carry germs that can harm you and your baby. Eat 4 or 5 small meals rather than 3 large meals a day. You may need to take these actions to prevent or treat constipation: Drink enough fluid to keep your urine pale yellow. Eat foods that are high in fiber, such as beans, whole grains, and fresh fruits and vegetables. Limit foods that are high in fat and processed sugars, such as fried or sweet foods. Activity Exercise only as directed by your health care provider. Most people can continue their usual exercise routine during pregnancy. Try to   exercise for 30 minutes at least 5 days a week. Stop exercising if you experience contractions in the uterus. Stop exercising if you develop pain or cramping in the lower abdomen or lower back. Avoid heavy lifting. Do not exercise if it is very hot or humid or if you are at a high altitude. If you choose to, you may continue to have sex unless your health care provider tells you not  to. Relieving pain and discomfort Take frequent breaks and rest with your legs raised (elevated) if you have leg cramps or low back pain. Take warm sitz baths to soothe any pain or discomfort caused by hemorrhoids. Use hemorrhoid cream if your health care provider approves. Wear a supportive bra to prevent discomfort from breast tenderness. If you develop varicose veins: Wear support hose as told by your health care provider. Elevate your feet for 15 minutes, 3-4 times a day. Limit salt in your diet. Safety Talk to your health care provider before traveling far distances. Do not use hot tubs, steam rooms, or saunas. Wear your seat belt at all times when driving or riding in a car. Talk with your health care provider if someone is verbally or physically abusive to you. Preparing for birth To prepare for the arrival of your baby: Take prenatal classes to understand, practice, and ask questions about labor and delivery. Visit the hospital and tour the maternity area. Purchase a rear-facing car seat and make sure you know how to install it in your car. Prepare the baby's room or sleeping area. Make sure to remove all pillows and stuffed animals from the baby's crib to prevent suffocation. General instructions Avoid cat litter boxes and soil used by cats. These carry germs that can cause birth defects in the baby. If you have a cat, ask someone to clean the litter box for you. Do not douche or use tampons. Do not use scented sanitary pads. Do not use any products that contain nicotine or tobacco, such as cigarettes, e-cigarettes, and chewing tobacco. If you need help quitting, ask your health care provider. Do not use any herbal remedies, illegal drugs, or medicines that were not prescribed to you. Chemicals in these products can harm your baby. Do not drink alcohol. You will have more frequent prenatal exams during the third trimester. During a routine prenatal visit, your health care provider  will do a physical exam, perform tests, and discuss your overall health. Keep all follow-up visits. This is important. Where to find more information American Pregnancy Association: americanpregnancy.org American College of Obstetricians and Gynecologists: acog.org/en/Womens%20Health/Pregnancy Office on Women's Health: womenshealth.gov/pregnancy Contact a health care provider if you have: A fever. Mild pelvic cramps, pelvic pressure, or nagging pain in your abdominal area or lower back. Vomiting or diarrhea. Bad-smelling vaginal discharge or foul-smelling urine. Pain when you urinate. A headache that does not go away when you take medicine. Visual changes or see spots in front of your eyes. Get help right away if: Your water breaks. You have regular contractions less than 5 minutes apart. You have spotting or bleeding from your vagina. You have severe abdominal pain. You have difficulty breathing. You have chest pain. You have fainting spells. You have not felt your baby move for the time period told by your health care provider. You have new or increased pain, swelling, or redness in an arm or leg. Summary The third trimester of pregnancy is from week 28 through week 40 (months 7 through 9). You may have more problems sleeping.   This can be caused by the size of your abdomen, an increased need to urinate, and an increase in your body's metabolism. You will have more frequent prenatal exams during the third trimester. Keep all follow-up visits. This is important. This information is not intended to replace advice given to you by your health care provider. Make sure you discuss any questions you have with your health care provider. Document Revised: 11/21/2019 Document Reviewed: 09/27/2019 Elsevier Patient Education  2022 [employer].  Contraception Choices Contraception, also called birth control, refers to methods or devices that prevent pregnancy. Hormonal methods Contraceptive  implant A contraceptive implant is a thin, plastic tube that contains a hormone that prevents pregnancy. It is different from an intrauterine device (IUD). It is inserted into the upper part of the arm by a health care provider. Implants can be effective for up to 3 years. Progestin-only injections Progestin-only injections are injections of progestin, a synthetic form of the hormone progesterone. They are given every 3 months by a health care provider. Birth control pills Birth control pills are pills that contain hormones that prevent pregnancy. They must be taken once a day, preferably at the same time each day. A prescription is needed to use this method of contraception. Birth control patch The birth control patch contains hormones that prevent pregnancy. It is placed on the skin and must be changed once a week for three weeks and removed on the fourth week. A prescription is needed to use this method of contraception. Vaginal ring A vaginal ring contains hormones that prevent pregnancy. It is placed in the vagina for three weeks and removed on the fourth week. After that, the process is repeated with a new ring. A prescription is needed to use this method of contraception. Emergency contraceptive Emergency contraceptives prevent pregnancy after unprotected sex. They come in pill form and can be taken up to 5 days after sex. They work best the sooner they are taken after having sex. Most emergency contraceptives are available without a prescription. This method should not be used as your only form of birth control. Barrier methods Female condom A female condom is a thin sheath that is worn over the penis during sex. Condoms keep sperm from going inside a woman's body. They can be used with a sperm-killing substance (spermicide) to increase their effectiveness. They should be thrown away after one use. Female condom A female condom is a soft, loose-fitting sheath that is put into the vagina before  sex. The condom keeps sperm from going inside a woman's body. They should be thrown away after one use. Diaphragm A diaphragm is a soft, dome-shaped barrier. It is inserted into the vagina before sex, along with a spermicide. The diaphragm blocks sperm from entering the uterus, and the spermicide kills sperm. A diaphragm should be left in the vagina for 6-8 hours after sex and removed within 24 hours. A diaphragm is prescribed and fitted by a health care provider. A diaphragm should be replaced every 1-2 years, after giving birth, after gaining more than 15 lb (6.8 kg), and after pelvic surgery. Cervical cap A cervical cap is a round, soft latex or plastic cup that fits over the cervix. It is inserted into the vagina before sex, along with spermicide. It blocks sperm from entering the uterus. The cap should be left in place for 6-8 hours after sex and removed within 48 hours. A cervical cap must be prescribed and fitted by a health care provider. It should be replaced  every 2 years. Sponge A sponge is a soft, circular piece of polyurethane foam with spermicide in it. The sponge helps block sperm from entering the uterus, and the spermicide kills sperm. To use it, you make it wet and then insert it into the vagina. It should be inserted before sex, left in for at least 6 hours after sex, and removed and thrown away within 30 hours. Spermicides Spermicides are chemicals that kill or block sperm from entering the cervix and uterus. They can come as a cream, jelly, suppository, foam, or tablet. A spermicide should be inserted into the vagina with an applicator at least 10-15 minutes before sex to allow time for it to work. The process must be repeated every time you have sex. Spermicides do not require a prescription. Intrauterine contraception Intrauterine device (IUD) An IUD is a T-shaped device that is put in a woman's uterus. There are two types: Hormone IUD.This type contains progestin, a synthetic  form of the hormone progesterone. This type can stay in place for 3-5 years. Copper IUD.This type is wrapped in copper wire. It can stay in place for 10 years. Permanent methods of contraception Female tubal ligation In this method, a woman's fallopian tubes are sealed, tied, or blocked during surgery to prevent eggs from traveling to the uterus. Hysteroscopic sterilization In this method, a small, flexible insert is placed into each fallopian tube. The inserts cause scar tissue to form in the fallopian tubes and block them, so sperm cannot reach an egg. The procedure takes about 3 months to be effective. Another form of birth control must be used during those 3 months. Female sterilization This is a procedure to tie off the tubes that carry sperm (vasectomy). After the procedure, the man can still ejaculate fluid (semen). Another form of birth control must be used for 3 months after the procedure. Natural planning methods Natural family planning In this method, a couple does not have sex on days when the woman could become pregnant. Calendar method In this method, the woman keeps track of the length of each menstrual cycle, identifies the days when pregnancy can happen, and does not have sex on those days. Ovulation method In this method, a couple avoids sex during ovulation. Symptothermal method This method involves not having sex during ovulation. The woman typically checks for ovulation by watching changes in her temperature and in the consistency of cervical mucus. Post-ovulation method In this method, a couple waits to have sex until after ovulation. Where to find more information Centers for Disease Control and Prevention: [employer].br Summary Contraception, also called birth control, refers to methods or devices that prevent pregnancy. Hormonal methods of contraception include implants, injections, pills, patches, vaginal rings, and emergency contraceptives. Barrier methods of  contraception can include female condoms, female condoms, diaphragms, cervical caps, sponges, and spermicides. There are two types of IUDs (intrauterine devices). An IUD can be put in a woman's uterus to prevent pregnancy for 3-5 years. Permanent sterilization can be done through a procedure for males and females. Natural family planning methods involve nothaving sex on days when the woman could become pregnant. This information is not intended to replace advice given to you by your health care provider. Make sure you discuss any questions you have with your health care provider. Document Revised: 11/19/2019 Document Reviewed: 11/19/2019 Elsevier Patient Education  2022 [employer].

## 2021-05-06 LAB — CBC
Hematocrit: 40.9 % (ref 34.0–46.6)
Hemoglobin: 13.3 g/dL (ref 11.1–15.9)
MCH: 26.5 pg — ABNORMAL LOW (ref 26.6–33.0)
MCHC: 32.5 g/dL (ref 31.5–35.7)
MCV: 82 fL (ref 79–97)
Platelets: 339 10*3/uL (ref 150–450)
RBC: 5.02 x10E6/uL (ref 3.77–5.28)
RDW: 13.1 % (ref 11.7–15.4)
WBC: 9.6 10*3/uL (ref 3.4–10.8)

## 2021-05-06 LAB — COMPREHENSIVE METABOLIC PANEL
ALT: 10 IU/L (ref 0–32)
AST: 7 IU/L (ref 0–40)
Albumin/Globulin Ratio: 1.4 (ref 1.2–2.2)
Albumin: 3.8 g/dL — ABNORMAL LOW (ref 3.9–5.0)
Alkaline Phosphatase: 138 IU/L — ABNORMAL HIGH (ref 44–121)
BUN/Creatinine Ratio: 7 — ABNORMAL LOW (ref 9–23)
BUN: 4 mg/dL — ABNORMAL LOW (ref 6–20)
Bilirubin Total: <0.2 mg/dL (ref 0.0–1.2)
CO2: 19 mmol/L — ABNORMAL LOW (ref 20–29)
Calcium: 9.6 mg/dL (ref 8.7–10.2)
Chloride: 105 mmol/L (ref 96–106)
Creatinine, Ser: 0.59 mg/dL (ref 0.57–1.00)
Globulin, Total: 2.8 g/dL (ref 1.5–4.5)
Glucose: 93 mg/dL (ref 70–99)
Potassium: 4.3 mmol/L (ref 3.5–5.2)
Sodium: 141 mmol/L (ref 134–144)
Total Protein: 6.6 g/dL (ref 6.0–8.5)
eGFR: 127 mL/min/{1.73_m2} (ref >59)

## 2021-05-06 LAB — PROTEIN / CREATININE RATIO, URINE
Creatinine, Urine: 49 mg/dL
Protein, Ur: 5.8 mg/dL
Protein/Creat Ratio: 118 mg/g creat (ref 0–200)

## 2021-05-23 ENCOUNTER — Other Ambulatory Visit: Payer: Self-pay | Admitting: Advanced Practice Midwife

## 2021-05-25 ENCOUNTER — Other Ambulatory Visit (HOSPITAL_COMMUNITY)
Admission: RE | Admit: 2021-05-25 | Discharge: 2021-05-25 | Disposition: A | Discharge status: Home / Self Care | Payer: Medicaid Other | Source: Ambulatory Visit | Attending: Obstetrics and Gynecology | Admitting: Obstetrics and Gynecology

## 2021-05-25 ENCOUNTER — Other Ambulatory Visit: Payer: Self-pay

## 2021-05-25 ENCOUNTER — Encounter: Payer: Self-pay | Admitting: Obstetrics and Gynecology

## 2021-05-25 ENCOUNTER — Ambulatory Visit (INDEPENDENT_AMBULATORY_CARE_PROVIDER_SITE_OTHER): Payer: Medicaid Other | Admitting: Obstetrics and Gynecology

## 2021-05-25 VITALS — BP 141/87 | HR 91 | Wt 261.2 lb

## 2021-05-25 DIAGNOSIS — O133 Gestational [pregnancy-induced] hypertension without significant proteinuria, third trimester: Secondary | ICD-10-CM | POA: Diagnosis not present

## 2021-05-25 DIAGNOSIS — Z2839 Other underimmunization status: Secondary | ICD-10-CM | POA: Diagnosis not present

## 2021-05-25 DIAGNOSIS — O0990 Supervision of high risk pregnancy, unspecified, unspecified trimester: Secondary | ICD-10-CM | POA: Diagnosis not present

## 2021-05-25 DIAGNOSIS — O09899 Supervision of other high risk pregnancies, unspecified trimester: Secondary | ICD-10-CM | POA: Insufficient documentation

## 2021-05-25 HISTORY — DX: Supervision of other high risk pregnancies, unspecified trimester: O09.899

## 2021-05-25 NOTE — Progress Notes (Signed)
Subjective:  Julie Finley is a 25 y.o. G3P2002 at [redacted]w[redacted]d being seen today for ongoing prenatal care.  She is currently monitored for the following issues for this high-risk pregnancy and has Anxiety; Gestational hypertension; Supervision of high-risk pregnancy; and Rubella non-immune status, antepartum on their problem list.  Patient reports general discomforts of pregnancy. Denies HA or visual changes.  Contractions: Irritability. Vag. Bleeding: None.  Movement: Present. Denies leaking of fluid.   The following portions of the patient's history were reviewed and updated as appropriate: allergies, current medications, past family history, past medical history, past social history, past surgical history and problem list. Problem list updated.  Objective:   Vitals:   05/25/21 1534  BP: (!) 141/87  Pulse: 91  Weight: 261 lb 3.2 oz (118.5 kg)    Fetal Status:     Movement: Present     General:  Alert, oriented and cooperative. Patient is in no acute distress.  Skin: Skin is warm and dry. No rash noted.   Cardiovascular: Normal heart rate noted  Respiratory: Normal respiratory effort, no problems with respiration noted  Abdomen: Soft, gravid, appropriate for gestational age. Pain/Pressure: Present     Pelvic:  Cervical exam performed        Extremities: Normal range of motion.  Edema: None  Mental Status: Normal mood and affect. Normal behavior. Normal judgment and thought content.   Urinalysis:      Assessment and Plan:  Pregnancy: G3P2002 at [redacted]w[redacted]d  1. Supervision of high risk pregnancy, antepartum GBS and vaginal cultures collected today Labor precautions Foley placement on Wednesday  2. Gestational hypertension, third trimester Stable U/S tomorrow. IOL scheduled for Thursday  3. Rubella non-immune status, antepartum Vaccine PP  Term labor symptoms and general obstetric precautions including but not limited to vaginal bleeding, contractions, leaking of fluid and fetal  movement were reviewed in detail with the patient. Please refer to After Visit Summary for other counseling recommendations.  Return for BP check 1 week from 05/29/21, 4 weeks PP visit from 05/29/21, foley placement this Wed afternoon.   Hermina Staggers, MD

## 2021-05-25 NOTE — Patient Instructions (Addendum)
Vaginal Delivery Vaginal delivery means that you give birth by pushing your baby out of your birth canal (vagina). Your health care team will help you before, during, and after vaginal delivery. Birth experiences are unique for every woman and every pregnancy, and birth experiences vary depending on where you choose to give birth. What are the risks and benefits? Generally, this is safe. However, problems may occur, including: Bleeding. Infection. Damage to other structures such as vaginal tearing. Allergic reactions to medicines. Despite the risks, benefits of vaginal delivery include less risk of bleeding and infection and a shorter recovery time compared to a Cesarean delivery. Cesarean delivery, or C-section, is the surgical delivery of a baby. What happens when I arrive at the birth center or hospital? Once you are in labor and have been admitted into the hospital or birth center, your health care team may: Review your pregnancy history and any concerns that you have. Talk with you about your birth plan and discuss pain control options. Check your blood pressure, breathing, and heartbeat. Assess your baby's heartbeat. Monitor your uterus for contractions. Check whether your bag of water (amniotic sac) has broken (ruptured). Insert an IV into one of your veins. This may be used to give you fluids and medicines. Monitoring Your health care team may assess your contractions (uterine monitoring) and your baby's heart rate (fetal monitoring). You may need to be monitored: Often, but not continuously (intermittently). All the time or for long periods at a time (continuously). Continuous monitoring may be needed if: You are taking certain medicines, such as medicine to relieve pain or make your contractions stronger. You have pregnancy or labor complications. Monitoring may be done by: Placing a special stethoscope or a handheld monitoring device on your abdomen to check your baby's heartbeat  and to check for contractions. Placing monitors on your abdomen (external monitors) to record your baby's heartbeat and the frequency and length of contractions. Placing monitors inside your uterus through your vagina (internal monitors) to record your baby's heartbeat and the frequency, length, and strength of your contractions. Depending on the type of monitor, it may remain in your uterus or on your baby's head until birth. Telemetry. This is a type of continuous monitoring that can be done with external or internal monitors. Instead of having to stay in bed, you are able to move around. Physical exam Your health care team may perform frequent physical exams. This may include: Checking how and where your baby is positioned in your uterus. Checking your cervix to determine: Whether it is thinning out (effacing). Whether it is opening up (dilating). What happens during labor and delivery? Normal labor and delivery is divided into the following three stages: Stage 1 This is the longest stage of labor. Throughout this stage, you will feel contractions. Contractions generally feel mild, infrequent, and irregular at first. They get stronger, more frequent, and more regular as you move through this stage. You may have contractions about every 2-3 minutes. This stage ends when your cervix is completely dilated to 4 inches (10 cm) and completely effaced. Stage 2 This stage starts once your cervix is completely effaced and dilated and lasts until the delivery of your baby. This is the stage where you will feel an urge to push your baby out of your vagina. You may feel stretching and burning pain, especially when the widest part of your baby's head passes through the vaginal opening (crowning). Once your baby is delivered, the umbilical cord will be clamped and   cut. Timing of cutting the cord will depend on your wishes, your baby's health, and your health care provider's practices. Your baby will be  placed on your bare chest (skin-to-skin contact) in an upright position and covered with a warm blanket. If you are choosing to breastfeed, watch your baby for feeding cues, like rooting or sucking, and help the baby to your breast for his or her first feeding. Stage 3 This stage starts immediately after the birth of your baby and ends after you deliver the placenta. This stage may take anywhere from 5 to 30 minutes. After your baby has been delivered, you will feel contractions as your body expels the placenta. These contractions also help your uterus get smaller and reduce bleeding. What can I expect after labor and delivery? After labor is over, you and your baby will be assessed closely until you are ready to go home. Your health care team will teach you how to care for yourself and your baby. You and your baby may be encouraged to stay in the same room (rooming in) during your hospital stay. This will help promote early bonding and successful breastfeeding. Your uterus will be checked and massaged regularly (fundal massage). You may continue to receive fluids and medicines through an IV. You will have some soreness and pain in your abdomen, vagina, and the area of skin between your vaginal opening and your anus (perineum). If an incision was made near your vagina (episiotomy) or if you had some vaginal tearing during delivery, cold compresses may be placed on your episiotomy or your tear. This helps to reduce pain and swelling. It is normal to have vaginal bleeding after delivery. Wear a sanitary pad for vaginal bleeding and discharge. Summary Vaginal delivery means that you will give birth by pushing your baby out of your birth canal (vagina). Your health care team will monitor you and your baby throughout the stages of labor. After you deliver your baby, your health care team will continue to assess you and your baby to ensure you are both recovering as expected after delivery. This  information is not intended to replace advice given to you by your health care provider. Make sure you discuss any questions you have with your health care provider. Document Revised: 05/12/2020 Document Reviewed: 05/12/2020 Elsevier Patient Education  2022 [employer] OUTPATIENT FOLEY BULB INDUCTION OF LABOR:  Information Sheet for Mothers and Family               What's a Foley Bulb Induction? A Foley bulb induction is a procedure where your provider inserts a catheter into your cervix. Once inside your womb, your provider inflates the balloon with a saline solution.   This puts pressure on your cervix and encourages dilation. The catheter falls out once your cervix dilates to 3-4 centimeters.     With any procedure, it's important that you know what to expect. The insertion of a Foley catheter can be a bit uncomfortable, and some women experience sharp pelvic pain. The pain may subside once the catheter is in place. You may experience some cramping when the Foley catheter is in place.  This is normal.     GO TO THE MATERNITY ADMISSIONS UNIT FOR THE FOLLOWING: Heavy vaginal bleeding Rupture of membranes (fluid that wets your underwear) Painful uterine contractions every 5 minutes or less Severe abdominal discomfort Decreased movement of the baby   

## 2021-05-25 NOTE — Addendum Note (Signed)
Addended by: Marjo Bicker on: 05/25/2021 04:44 PM   Modules accepted: Orders

## 2021-05-26 ENCOUNTER — Ambulatory Visit: Payer: Medicaid Other

## 2021-05-26 LAB — CERVICOVAGINAL ANCILLARY ONLY
Chlamydia: NEGATIVE
Comment: NEGATIVE
Comment: NORMAL
Neisseria Gonorrhea: NEGATIVE

## 2021-05-27 ENCOUNTER — Encounter: Payer: Medicaid Other | Admitting: Family Medicine

## 2021-05-27 ENCOUNTER — Other Ambulatory Visit: Payer: Self-pay

## 2021-05-27 ENCOUNTER — Other Ambulatory Visit: Payer: Self-pay | Admitting: Family Medicine

## 2021-05-27 ENCOUNTER — Ambulatory Visit (HOSPITAL_BASED_OUTPATIENT_CLINIC_OR_DEPARTMENT_OTHER): Payer: Medicaid Other

## 2021-05-27 ENCOUNTER — Ambulatory Visit: Payer: Medicaid Other | Admitting: *Deleted

## 2021-05-27 VITALS — BP 123/77 | HR 74

## 2021-05-27 DIAGNOSIS — O0993 Supervision of high risk pregnancy, unspecified, third trimester: Secondary | ICD-10-CM

## 2021-05-27 DIAGNOSIS — O133 Gestational [pregnancy-induced] hypertension without significant proteinuria, third trimester: Secondary | ICD-10-CM

## 2021-05-27 DIAGNOSIS — Z363 Encounter for antenatal screening for malformations: Secondary | ICD-10-CM | POA: Insufficient documentation

## 2021-05-27 DIAGNOSIS — O0990 Supervision of high risk pregnancy, unspecified, unspecified trimester: Secondary | ICD-10-CM

## 2021-05-27 DIAGNOSIS — O0933 Supervision of pregnancy with insufficient antenatal care, third trimester: Secondary | ICD-10-CM | POA: Insufficient documentation

## 2021-05-27 DIAGNOSIS — O403XX0 Polyhydramnios, third trimester, not applicable or unspecified: Secondary | ICD-10-CM | POA: Insufficient documentation

## 2021-05-27 DIAGNOSIS — Z3A36 36 weeks gestation of pregnancy: Secondary | ICD-10-CM | POA: Insufficient documentation

## 2021-05-27 DIAGNOSIS — O99213 Obesity complicating pregnancy, third trimester: Secondary | ICD-10-CM | POA: Insufficient documentation

## 2021-05-28 ENCOUNTER — Inpatient Hospital Stay (HOSPITAL_COMMUNITY)
Admission: RE | Admit: 2021-05-28 | Discharge: 2021-06-01 | DRG: 806 | Disposition: A | Discharge status: Home / Self Care | Payer: Medicaid Other | Attending: Obstetrics & Gynecology | Admitting: Obstetrics & Gynecology

## 2021-05-28 ENCOUNTER — Inpatient Hospital Stay (HOSPITAL_COMMUNITY): Payer: Medicaid Other

## 2021-05-28 ENCOUNTER — Encounter: Payer: Self-pay | Admitting: Obstetrics and Gynecology

## 2021-05-28 ENCOUNTER — Encounter (HOSPITAL_COMMUNITY): Payer: Self-pay | Admitting: Family Medicine

## 2021-05-28 ENCOUNTER — Other Ambulatory Visit: Payer: Self-pay

## 2021-05-28 DIAGNOSIS — Z23 Encounter for immunization: Secondary | ICD-10-CM

## 2021-05-28 DIAGNOSIS — O99214 Obesity complicating childbirth: Secondary | ICD-10-CM | POA: Diagnosis present

## 2021-05-28 DIAGNOSIS — O09899 Supervision of other high risk pregnancies, unspecified trimester: Secondary | ICD-10-CM

## 2021-05-28 DIAGNOSIS — O139 Gestational [pregnancy-induced] hypertension without significant proteinuria, unspecified trimester: Secondary | ICD-10-CM | POA: Diagnosis present

## 2021-05-28 DIAGNOSIS — Z7982 Long term (current) use of aspirin: Secondary | ICD-10-CM

## 2021-05-28 DIAGNOSIS — O99892 Other specified diseases and conditions complicating childbirth: Secondary | ICD-10-CM | POA: Diagnosis present

## 2021-05-28 DIAGNOSIS — O134 Gestational [pregnancy-induced] hypertension without significant proteinuria, complicating childbirth: Secondary | ICD-10-CM | POA: Diagnosis present

## 2021-05-28 DIAGNOSIS — A6000 Herpesviral infection of urogenital system, unspecified: Secondary | ICD-10-CM | POA: Diagnosis present

## 2021-05-28 DIAGNOSIS — R21 Rash and other nonspecific skin eruption: Secondary | ICD-10-CM | POA: Diagnosis present

## 2021-05-28 DIAGNOSIS — Z87891 Personal history of nicotine dependence: Secondary | ICD-10-CM | POA: Diagnosis not present

## 2021-05-28 DIAGNOSIS — O0990 Supervision of high risk pregnancy, unspecified, unspecified trimester: Secondary | ICD-10-CM

## 2021-05-28 DIAGNOSIS — O99824 Streptococcus B carrier state complicating childbirth: Secondary | ICD-10-CM | POA: Diagnosis present

## 2021-05-28 DIAGNOSIS — O99820 Streptococcus B carrier state complicating pregnancy: Secondary | ICD-10-CM

## 2021-05-28 DIAGNOSIS — Z3A37 37 weeks gestation of pregnancy: Secondary | ICD-10-CM

## 2021-05-28 DIAGNOSIS — Z20822 Contact with and (suspected) exposure to covid-19: Secondary | ICD-10-CM | POA: Diagnosis present

## 2021-05-28 DIAGNOSIS — O9832 Other infections with a predominantly sexual mode of transmission complicating childbirth: Secondary | ICD-10-CM | POA: Diagnosis present

## 2021-05-28 DIAGNOSIS — Z2839 Other underimmunization status: Secondary | ICD-10-CM

## 2021-05-28 HISTORY — DX: Streptococcus B carrier state complicating pregnancy: O99.820

## 2021-05-28 LAB — COMPREHENSIVE METABOLIC PANEL
ALT: 12 U/L (ref 0–44)
AST: 24 U/L (ref 15–41)
Albumin: 2.5 g/dL — ABNORMAL LOW (ref 3.5–5.0)
Alkaline Phosphatase: 144 U/L — ABNORMAL HIGH (ref 38–126)
Anion gap: 8 (ref 5–15)
BUN: 5 mg/dL — ABNORMAL LOW (ref 6–20)
CO2: 19 mmol/L — ABNORMAL LOW (ref 22–32)
Calcium: 9.2 mg/dL (ref 8.9–10.3)
Chloride: 108 mmol/L (ref 98–111)
Creatinine, Ser: 0.58 mg/dL (ref 0.44–1.00)
GFR, Estimated: >60 mL/min (ref >60)
Glucose, Bld: 78 mg/dL (ref 70–99)
Potassium: 4.7 mmol/L (ref 3.5–5.1)
Sodium: 135 mmol/L (ref 135–145)
Total Bilirubin: 0.8 mg/dL (ref 0.3–1.2)
Total Protein: 5.8 g/dL — ABNORMAL LOW (ref 6.5–8.1)

## 2021-05-28 LAB — TYPE AND SCREEN
ABO/RH(D): O POS
Antibody Screen: NEGATIVE

## 2021-05-28 LAB — CBC
HCT: 38.4 % (ref 36.0–46.0)
Hemoglobin: 12.4 g/dL (ref 12.0–15.0)
MCH: 26.4 pg (ref 26.0–34.0)
MCHC: 32.3 g/dL (ref 30.0–36.0)
MCV: 81.9 fL (ref 80.0–100.0)
Platelets: 314 10*3/uL (ref 150–400)
RBC: 4.69 MIL/uL (ref 3.87–5.11)
RDW: 14 % (ref 11.5–15.5)
WBC: 8.7 10*3/uL (ref 4.0–10.5)
nRBC: 0 % (ref 0.0–0.2)

## 2021-05-28 LAB — RESP PANEL BY RT-PCR (FLU A&B, COVID) ARPGX2
Influenza A by PCR: NEGATIVE
Influenza B by PCR: NEGATIVE
SARS Coronavirus 2 by RT PCR: NEGATIVE

## 2021-05-28 LAB — PROTEIN / CREATININE RATIO, URINE
Creatinine, Urine: 33.33 mg/dL
Total Protein, Urine: <6 mg/dL

## 2021-05-28 LAB — CULTURE, BETA STREP (GROUP B ONLY): Strep Gp B Culture: POSITIVE — AB

## 2021-05-28 MED ORDER — LACTATED RINGERS IV SOLN: INTRAVENOUS | Status: DC

## 2021-05-28 MED ORDER — LACTATED RINGERS IV SOLN
500.0000 mL | INTRAVENOUS | Status: DC | PRN
Start: 2021-05-28 — End: 2021-05-30
  Administered 2021-05-30: 1000 mL via INTRAVENOUS

## 2021-05-28 MED ORDER — ACETAMINOPHEN 325 MG PO TABS: 650.0000 mg | ORAL_TABLET | ORAL | Status: DC | PRN

## 2021-05-28 MED ORDER — SODIUM CHLORIDE 0.9 % IV SOLN
5.0000 10*6.[IU] | Freq: Once | INTRAVENOUS | Status: AC
  Administered 2021-05-28: 5 10*6.[IU] via INTRAVENOUS
  Filled 2021-05-28: qty 5

## 2021-05-28 MED ORDER — OXYTOCIN-SODIUM CHLORIDE 30-0.9 UT/500ML-% IV SOLN
2.5000 [IU]/h | INTRAVENOUS | Status: DC
  Administered 2021-05-30: 2.5 [IU]/h via INTRAVENOUS

## 2021-05-28 MED ORDER — OXYTOCIN-SODIUM CHLORIDE 30-0.9 UT/500ML-% IV SOLN
1.0000 m[IU]/min | INTRAVENOUS | Status: DC
  Filled 2021-05-28: qty 500

## 2021-05-28 MED ORDER — TERBUTALINE SULFATE 1 MG/ML IJ SOLN: 0.2500 mg | Freq: Once | INTRAMUSCULAR | Status: DC | PRN

## 2021-05-28 MED ORDER — FENTANYL CITRATE (PF) 100 MCG/2ML IJ SOLN
50.0000 ug | INTRAMUSCULAR | Status: DC | PRN
Start: 2021-05-28 — End: 2021-05-30
  Administered 2021-05-29: 100 ug via INTRAVENOUS
  Filled 2021-05-28: qty 2

## 2021-05-28 MED ORDER — ONDANSETRON HCL 4 MG/2ML IJ SOLN: 4.0000 mg | Freq: Four times a day (QID) | INTRAMUSCULAR | Status: DC | PRN

## 2021-05-28 MED ORDER — SOD CITRATE-CITRIC ACID 500-334 MG/5ML PO SOLN: 30.0000 mL | ORAL | Status: DC | PRN

## 2021-05-28 MED ORDER — MISOPROSTOL 50MCG HALF TABLET
50.0000 ug | ORAL_TABLET | Freq: Once | ORAL | Status: AC
  Administered 2021-05-28: 50 ug via BUCCAL

## 2021-05-28 MED ORDER — MISOPROSTOL 50MCG HALF TABLET
50.0000 ug | ORAL_TABLET | ORAL | Status: DC
  Administered 2021-05-29 (×3): 50 ug via BUCCAL
  Filled 2021-05-28 (×3): qty 1

## 2021-05-28 MED ORDER — PENICILLIN G POT IN DEXTROSE 60000 UNIT/ML IV SOLN
3.0000 10*6.[IU] | INTRAVENOUS | Status: DC
  Administered 2021-05-29 – 2021-05-30 (×8): 3 10*6.[IU] via INTRAVENOUS
  Filled 2021-05-28 (×8): qty 50

## 2021-05-28 MED ORDER — OXYTOCIN BOLUS FROM INFUSION
333.0000 mL | Freq: Once | INTRAVENOUS | Status: AC
  Administered 2021-05-30: 333 mL via INTRAVENOUS

## 2021-05-28 MED ORDER — LIDOCAINE HCL (PF) 1 % IJ SOLN: 30.0000 mL | INTRAMUSCULAR | Status: DC | PRN

## 2021-05-28 MED ORDER — MISOPROSTOL 50MCG HALF TABLET
ORAL_TABLET | ORAL | Status: AC
  Filled 2021-05-28: qty 1

## 2021-05-28 NOTE — H&P (Addendum)
OBSTETRIC ADMISSION HISTORY AND PHYSICAL  Julie Finley is a 24 y.o. female G3P2002 with IUP at [redacted]w[redacted]d by 30 week [place] presenting for IOL due to gestational hypertension. She reports +FMs, no LOF, no VB, no blurry vision, headaches, peripheral edema, or RUQ pain.  She plans on breast feeding. She is undecided about birth control postpartum.   She received her prenatal care at [facility].   Dating: By [place] --->  Estimated Date of Delivery: 06/18/21  Sono:   @[redacted]w[redacted]d , normal anatomy, cephalic presentation, fundal placental lie, 2967 g, 47% EFW  Prenatal History/Complications:  Gestational hypertension Rubella non-immune GBS positive  Obesity (BMI 42) Late to prenatal care Hx genital herpes (no recent outbreak, on Valtrex)  Past Medical History: Past Medical History:  Diagnosis Date   Anxiety    Genital herpes    Medical history non-contributory    Post term pregnancy at [redacted] weeks gestation 08/21/2016   Pregnancy induced hypertension    had postpartum magnesium   Pyelonephritis 2016   Vaginal delivery 12/07/2014    Past Surgical History: Past Surgical History:  Procedure Laterality Date   NO PAST SURGERIES      Obstetrical History: OB History     Gravida  3   Para  2   Term  2   Preterm  0   AB  0   Living  2      SAB  0   IAB  0   Ectopic  0   Multiple  0   Live Births  2           Social History Social History   Socioeconomic History   Marital status: Single    Spouse name: Not on file   Number of children: Not on file   Years of education: Not on file   Highest education level: Not on file  Occupational History   Occupation: housekeeper  Tobacco Use   Smoking status: Former    Packs/day: 0.50    Types: Cigarettes   Smokeless tobacco: Never  Vaping Use   Vaping Use: Never used  Substance and Sexual Activity   Alcohol use: Not Currently   Drug use: No   Sexual activity: Yes    Birth control/protection: None  Other Topics Concern   Not on  file  Social History Narrative   Not on file   Social Determinants of Health   Financial Resource Strain: Not on file  Food Insecurity: No Food Insecurity   Worried About Running Out of Food in the Last Year: Never true   [place] in the Last Year: Never true  Transportation Needs: No Transportation Needs   Lack of Transportation (Medical): No   Lack of Transportation (Non-Medical): No  Physical Activity: Not on file  Stress: Not on file  Social Connections: Not on file    Family History: History reviewed. No pertinent family history.  Allergies: No Known Allergies  Medications Prior to Admission  Medication Sig Dispense Refill Last Dose   aspirin 81 MG chewable tablet Chew by mouth daily.      Prenatal Vit-Fe Fumarate-FA (MULTIVITAMIN-PRENATAL) 27-0.8 MG TABS tablet Take 1 tablet by mouth daily at 12 noon.      valACYclovir (VALTREX) 500 MG tablet Take 500 mg by mouth 2 (two) times daily.        Review of Systems  All systems reviewed and negative except as stated in HPI  Temperature 98.1 F (36.7 C), temperature source Oral, height 5\' 6"  (  1.676 m), weight 118.4 kg, last menstrual period 08/13/2020, unknown if currently breastfeeding.  General appearance: alert, cooperative, and no distress Lungs: normal work of breathing on room air Heart: normal rate, warm and well perfused  Abdomen: soft, non-tender, gravid  Pelvic: scattered papular rash to bilateral labia and buttocks, white discharge overlying, non-tender to palpation, normal vagina with white discharge present, normal cervix Extremities: no LE edema or calf tenderness to palpation   Presentation:  Cephalic  Fetal monitoring: Baseline 135 bpm, moderate variability, + accels, no decels  Uterine activity: Contractions every 3-4 minutes Dilation: 1 Effacement (%): Thick Station: -3 Exam by:: E Chipps RN   Prenatal labs: ABO, Rh: --/--/O POS (12/01 2030) Antibody: NEG (12/01 2030) Rubella: Nonimmune  (08/01 0000) RPR: Nonreactive (08/01 0000)  HBsAg: Negative (08/01 0000)  HIV: Non-reactive (08/01 0000)  GBS: Positive/-- (11/28 1657)  2 hr Glucola normal Genetic screening - normal quad screen and CF screen  Anatomy US normal   Prenatal Transfer Tool  Maternal Diabetes: No Genetic Screening: Normal Maternal Ultrasounds/Referrals: Normal Fetal Ultrasounds or other Referrals:  None Maternal Substance Abuse:  No Significant Maternal Medications:  None Significant Maternal Lab Results: Group B Strep positive  Results for orders placed or performed during the hospital encounter of 05/28/21 (from the past 24 hour(s))  CBC   Collection Time: 05/28/21  8:29 PM  Result Value Ref Range   WBC 8.7 4.0 - 10.5 K/uL   RBC 4.69 3.87 - 5.11 MIL/uL   Hemoglobin 12.4 12.0 - 15.0 g/dL   HCT 78.4 69.6 - 29.5 %   MCV 81.9 80.0 - 100.0 fL   MCH 26.4 26.0 - 34.0 pg   MCHC 32.3 30.0 - 36.0 g/dL   RDW 28.4 13.2 - 44.0 %   Platelets 314 150 - 400 K/uL   nRBC 0.0 0.0 - 0.2 %  Protein / creatinine ratio, urine   Collection Time: 05/28/21  8:29 PM  Result Value Ref Range   Creatinine, Urine 33.33 mg/dL   Total Protein, Urine <6 mg/dL   Protein Creatinine Ratio        0.00 - 0.15 mg/mg[Cre]  Comprehensive metabolic panel   Collection Time: 05/28/21  8:29 PM  Result Value Ref Range   Sodium 135 135 - 145 mmol/L   Potassium 4.7 3.5 - 5.1 mmol/L   Chloride 108 98 - 111 mmol/L   CO2 19 (L) 22 - 32 mmol/L   Glucose, Bld 78 70 - 99 mg/dL   BUN 5 (L) 6 - 20 mg/dL   Creatinine, Ser 1.02 0.44 - 1.00 mg/dL   Calcium 9.2 8.9 - 72.5 mg/dL   Total Protein 5.8 (L) 6.5 - 8.1 g/dL   Albumin 2.5 (L) 3.5 - 5.0 g/dL   AST 24 15 - 41 U/L   ALT 12 0 - 44 U/L   Alkaline Phosphatase 144 (H) 38 - 126 U/L   Total Bilirubin 0.8 0.3 - 1.2 mg/dL   GFR, Estimated >36 >64 mL/min   Anion gap 8 5 - 15  Type and screen   Collection Time: 05/28/21  8:30 PM  Result Value Ref Range   ABO/RH(D) O POS    Antibody Screen  NEG    Sample Expiration      05/31/2021,2359 Performed at [facility], [address] [place], [place] [address]   Resp Panel by RT-PCR (Flu A&B, Covid) Nasopharyngeal Swab   Collection Time: 05/28/21  8:31 PM   Specimen: Nasopharyngeal Swab; Nasopharyngeal(NP) swabs  in vial transport medium  Result Value Ref Range   SARS Coronavirus 2 by RT PCR NEGATIVE NEGATIVE   Influenza A by PCR NEGATIVE NEGATIVE   Influenza B by PCR NEGATIVE NEGATIVE    Patient Active Problem List   Diagnosis Date Noted   GBS (group B Streptococcus carrier), +RV culture, currently pregnant 05/28/2021   Rubella non-immune status, antepartum 05/25/2021   Gestational hypertension 05/04/2021   Supervision of high-risk pregnancy 05/04/2021   Anxiety     Assessment/Plan:  Christianna Gutt is a 25 y.o. G3P2002 at [redacted]w[redacted]d here for IOL due to [place].  #Labor: Buccal Cytotec given upon admission. Will reassess in 4 hours and attempt FB placement at that time if appropriate.  #Pain: PRN #FWB: Cat 1  #ID:  GBS pos > PCN #MOF: Breast   #MOC: Undecided   #gHTN: Mild range BP. Pre-E labs normal. Asymptomatic. Will continue to monitor.  #Hx of genital herpes: On Valtrex. Last outbreak was years ago. No active lesions noted on SSE. Okay to proceed with induction. Will continue home Valtrex while inpatient.   #Genital rash: Rash noted on bilateral labia and buttocks area. Patient reports she has had symptoms for two weeks and feels it is yeast related. Does not feel like HSV outbreak. Agree that rash appears to be most similar to candidal rash. Dr. Ilda Basset to bedside to evaluate as well and agrees. Plan to treat with Diflucan.   Genia Del, MD  05/28/2021, 11:02 PM

## 2021-05-29 ENCOUNTER — Inpatient Hospital Stay (HOSPITAL_COMMUNITY): Payer: Medicaid Other | Admitting: Anesthesiology

## 2021-05-29 LAB — RPR: RPR Ser Ql: NONREACTIVE

## 2021-05-29 MED ORDER — OXYTOCIN-SODIUM CHLORIDE 30-0.9 UT/500ML-% IV SOLN
1.0000 m[IU]/min | INTRAVENOUS | Status: DC
  Administered 2021-05-29: 8 m[IU]/min via INTRAVENOUS
  Administered 2021-05-29 – 2021-05-30 (×3): 2 m[IU]/min via INTRAVENOUS

## 2021-05-29 MED ORDER — TERBUTALINE SULFATE 1 MG/ML IJ SOLN: 0.2500 mg | Freq: Once | INTRAMUSCULAR | Status: DC | PRN

## 2021-05-29 MED ORDER — LACTATED RINGERS IV SOLN: 500.0000 mL | Freq: Once | INTRAVENOUS | Status: DC

## 2021-05-29 MED ORDER — DIPHENHYDRAMINE HCL 50 MG/ML IJ SOLN: 12.5000 mg | INTRAMUSCULAR | Status: DC | PRN

## 2021-05-29 MED ORDER — LACTATED RINGERS AMNIOINFUSION
INTRAVENOUS | Status: DC
  Administered 2021-05-29: 150 mL/h via INTRAUTERINE

## 2021-05-29 MED ORDER — VALACYCLOVIR HCL 500 MG PO TABS
500.0000 mg | ORAL_TABLET | Freq: Two times a day (BID) | ORAL | Status: DC
  Administered 2021-05-29 (×2): 500 mg via ORAL
  Filled 2021-05-29 (×2): qty 1

## 2021-05-29 MED ORDER — LACTATED RINGERS AMNIOINFUSION: INTRAVENOUS | Status: DC

## 2021-05-29 MED ORDER — FENTANYL-BUPIVACAINE-NACL 0.5-0.125-0.9 MG/250ML-% EP SOLN
12.0000 mL/h | EPIDURAL | Status: DC | PRN
  Administered 2021-05-29: 12 mL/h via EPIDURAL
  Filled 2021-05-29: qty 250

## 2021-05-29 MED ORDER — EPHEDRINE 5 MG/ML INJ: 10.0000 mg | INTRAVENOUS | Status: DC | PRN

## 2021-05-29 MED ORDER — PHENYLEPHRINE 40 MCG/ML (10ML) SYRINGE FOR IV PUSH (FOR BLOOD PRESSURE SUPPORT)
80.0000 ug | PREFILLED_SYRINGE | INTRAVENOUS | Status: DC | PRN
  Administered 2021-05-30: 80 ug via INTRAVENOUS

## 2021-05-29 MED ORDER — LACTATED RINGERS AMNIOINFUSION
Freq: Once | INTRAVENOUS | Status: AC
  Administered 2021-05-29: 300 mL via INTRAUTERINE

## 2021-05-29 MED ORDER — PHENYLEPHRINE 40 MCG/ML (10ML) SYRINGE FOR IV PUSH (FOR BLOOD PRESSURE SUPPORT)
80.0000 ug | PREFILLED_SYRINGE | INTRAVENOUS | Status: DC | PRN
  Filled 2021-05-29: qty 10

## 2021-05-29 MED ORDER — LIDOCAINE HCL (PF) 1 % IJ SOLN
INTRAMUSCULAR | Status: DC | PRN
  Administered 2021-05-29 (×2): 5 mL via EPIDURAL

## 2021-05-29 NOTE — Progress Notes (Signed)
Due to ongoing variable decelerations, amnioinfusion started and Pitocin stopped to allow for fetal recovery. Will reassess after 30-45 minutes and restart Pitocin as able.   Julie Field, MD

## 2021-05-29 NOTE — Plan of Care (Signed)

## 2021-05-29 NOTE — Anesthesia Preprocedure Evaluation (Signed)
Anesthesia Evaluation  Patient identified by MRN, date of birth, ID band Patient awake    Reviewed: Allergy & Precautions, H&P , NPO status , Patient's Chart, lab work & pertinent test results  Airway Mallampati: II   Neck ROM: full    Dental   Pulmonary former smoker,    breath sounds clear to auscultation       Cardiovascular hypertension,  Rhythm:regular Rate:Normal     Neuro/Psych    GI/Hepatic   Endo/Other  Morbid obesity  Renal/GU      Musculoskeletal   Abdominal   Peds  Hematology   Anesthesia Other Findings   Reproductive/Obstetrics (+) Pregnancy                             Anesthesia Physical Anesthesia Plan  ASA: 2  Anesthesia Plan: Epidural   Post-op Pain Management:    Induction: Intravenous  PONV Risk Score and Plan: 2 and Treatment may vary due to age or medical condition  Airway Management Planned: Natural Airway  Additional Equipment:   Intra-op Plan:   Post-operative Plan:   Informed Consent: I have reviewed the patients History and Physical, chart, labs and discussed the procedure including the risks, benefits and alternatives for the proposed anesthesia with the patient or authorized representative who has indicated his/her understanding and acceptance.       Plan Discussed with: CRNA, Anesthesiologist and Surgeon  Anesthesia Plan Comments:         Anesthesia Quick Evaluation

## 2021-05-29 NOTE — Progress Notes (Signed)
Labor Progress Note Julie Finley is a 25 y.o. G3P2002 at [redacted]w[redacted]d who presented for IOL due to gHTN.  S: Doing well. Feeling more contractions since AROM and is requesting epidural.  O:  BP 132/76   Pulse 79   Temp 98.7 F (37.1 C) (Oral)   Resp 18   Ht 5\' 6"  (1.676 m)   Wt 118.4 kg   LMP 08/13/2020 (Approximate)   BMI 42.13 kg/m   EFM: Baseline 140 bpm, moderate variability, no accels, variable decels  Toco: Contractions every 2-3 minutes  CVE: Dilation: 5 Effacement (%): 60 Cervical Position: Posterior Station: -2 Presentation: Vertex (reassessed by MD to rule out prolapsed cord) Exam by:: Dr. [email or web address]  A&P: 25 y.o. 30 [redacted]w[redacted]d   #Labor: Progressing. Called to bedside for IUPC placement due to variable decelerations after AROM. IUPC placed without difficulty. Shallow variables noted. Will continue to monitor closely and start amnioinfusion if decelerations are recurrent or become deep in nature. #Pain: Requesting epidural #FWB: Cat 2 due to variable decels. Continues to have great variability. Will continue to monitor and consider amnioinfusion as needed.  #GBS positive > PCN  #gHTN: Normal to mild range BP. No symptoms. Will continue to monitor.  [redacted]w[redacted]d, MD 9:16 PM

## 2021-05-29 NOTE — Progress Notes (Signed)
Julie Finley is a 25 y.o. G3P2002 at [redacted]w[redacted]d  admitted for induction of labor due to [facility].  Subjective: Reports she overall feels ok  Objective: BP (!) 151/87 (BP Location: Right Arm)   Pulse 77   Temp 98.5 F (36.9 C) (Axillary)   Resp 17   Ht 5\' 6"  (1.676 m)   Wt 118.4 kg   LMP 08/13/2020 (Approximate)   BMI 42.13 kg/m  No intake/output data recorded. No intake/output data recorded.  FHT:  FHR: 130 bpm, variability: moderate,  accelerations:  Present,  decelerations:  Absent UC:   irregular, every 1-3 minutes SVE:   Dilation: 3 Effacement (%): Thick Station: Ballotable Exam by:: E Chipps RN  Labs: Lab Results  Component Value Date   WBC 8.7 05/28/2021   HGB 12.4 05/28/2021   HCT 38.4 05/28/2021   MCV 81.9 05/28/2021   PLT 314 05/28/2021    Assessment / Plan: Cervix still thick at check, s/p foley bulb. Will do another cytotec at 1000 AM for further ripening.  gHTN Patient with BP in mild range in 130s-150s/80s-90s. No symptoms - continue to monitor   Fetal Wellbeing:  Category I  I/D:   GBS pos>PCN   14/06/2020 05/29/2021, 9:48 AM

## 2021-05-29 NOTE — Anesthesia Procedure Notes (Signed)
Epidural Patient location during procedure: OB Start time: 05/29/2021 10:16 PM End time: 05/29/2021 10:26 PM  Staffing Anesthesiologist: Achille Rich, MD Performed: anesthesiologist   Preanesthetic Checklist Completed: patient identified, IV checked, site marked, risks and benefits discussed, monitors and equipment checked, pre-op evaluation and timeout performed  Epidural Patient position: sitting Prep: DuraPrep Patient monitoring: heart rate, cardiac monitor, continuous pulse ox and blood pressure Approach: midline Location: L2-L3 Injection technique: LOR saline  Needle:  Needle type: Tuohy  Needle gauge: 17 G Needle length: 9 cm Needle insertion depth: 8 cm Catheter type: closed end flexible Catheter size: 19 Gauge Catheter at skin depth: 14 cm Test dose: negative and Other  Assessment Events: blood not aspirated, injection not painful, no injection resistance and negative IV test  Additional Notes Informed consent obtained prior to proceeding including risk of failure, 1% risk of PDPH, risk of minor discomfort and bruising.  Discussed rare but serious complications including epidural abscess, permanent nerve injury, epidural hematoma.  Discussed alternatives to epidural analgesia and patient desires to proceed.  Timeout performed pre-procedure verifying patient name, procedure, and platelet count.  Patient tolerated procedure well. Reason for block:procedure for pain

## 2021-05-29 NOTE — Progress Notes (Signed)
Labor Progress Note Aylissa Heinemann is a 25 y.o. G3P2002 at [redacted]w[redacted]d who presented for IOL due to gHTN.  S: Doing well. No concerns at this time.  O:  BP (!) 141/80   Pulse 80   Temp (!) 97.5 F (36.4 C) (Axillary)   Resp 17   Ht 5\' 6"  (1.676 m)   Wt 118.4 kg   LMP 08/13/2020 (Approximate)   BMI 42.13 kg/m   EFM: Baseline 130 bpm, moderate variability, + accels, no decels  Toco: Contractions every 1-4 minutes  CVE: Dilation: 2 Effacement (%): Thick Cervical Position: Posterior Station: Ballotable Presentation: Vertex Exam by:: Dr. [email or web address]   A&P: 25 y.o. 30 [redacted]w[redacted]d   #Labor: Progressing well. Foley balloon placed this check without difficulty. Additional dose of Cytotec given. Will reassess in 4 hours.  #Pain: PRN #FWB: Cat 1  #GBS positive > PCN  #gHTN: Continues to have mild range BP. Asymptomatic. Will continue to monitor.  [redacted]w[redacted]d, MD 1:47 AM

## 2021-05-30 ENCOUNTER — Encounter (HOSPITAL_COMMUNITY): Payer: Self-pay | Admitting: Family Medicine

## 2021-05-30 DIAGNOSIS — Z3A37 37 weeks gestation of pregnancy: Secondary | ICD-10-CM

## 2021-05-30 DIAGNOSIS — O99824 Streptococcus B carrier state complicating childbirth: Secondary | ICD-10-CM

## 2021-05-30 DIAGNOSIS — O134 Gestational [pregnancy-induced] hypertension without significant proteinuria, complicating childbirth: Secondary | ICD-10-CM

## 2021-05-30 MED ORDER — DIBUCAINE (PERIANAL) 1 % EX OINT: 1.0000 "application " | TOPICAL_OINTMENT | CUTANEOUS | Status: DC | PRN

## 2021-05-30 MED ORDER — BENZOCAINE-MENTHOL 20-0.5 % EX AERO
1.0000 "application " | INHALATION_SPRAY | CUTANEOUS | Status: DC | PRN
  Administered 2021-05-30: 1 via TOPICAL
  Filled 2021-05-30: qty 56

## 2021-05-30 MED ORDER — ONDANSETRON HCL 4 MG/2ML IJ SOLN: 4.0000 mg | INTRAMUSCULAR | Status: DC | PRN

## 2021-05-30 MED ORDER — ONDANSETRON HCL 4 MG PO TABS: 4.0000 mg | ORAL_TABLET | ORAL | Status: DC | PRN

## 2021-05-30 MED ORDER — IBUPROFEN 600 MG PO TABS
600.0000 mg | ORAL_TABLET | Freq: Four times a day (QID) | ORAL | Status: DC
  Administered 2021-05-30 – 2021-06-01 (×8): 600 mg via ORAL
  Filled 2021-05-30 (×9): qty 1

## 2021-05-30 MED ORDER — NIFEDIPINE ER OSMOTIC RELEASE 30 MG PO TB24
30.0000 mg | ORAL_TABLET | Freq: Every day | ORAL | Status: DC
  Administered 2021-05-30 – 2021-06-01 (×3): 30 mg via ORAL
  Filled 2021-05-30 (×3): qty 1

## 2021-05-30 MED ORDER — ACETAMINOPHEN 325 MG PO TABS
650.0000 mg | ORAL_TABLET | ORAL | Status: DC | PRN
  Administered 2021-06-01: 650 mg via ORAL
  Filled 2021-05-30 (×2): qty 2

## 2021-05-30 MED ORDER — PRENATAL MULTIVITAMIN CH
1.0000 | ORAL_TABLET | Freq: Every day | ORAL | Status: DC
  Administered 2021-05-30 – 2021-05-31 (×2): 1 via ORAL
  Filled 2021-05-30 (×3): qty 1

## 2021-05-30 MED ORDER — DIPHENHYDRAMINE HCL 25 MG PO CAPS: 25.0000 mg | ORAL_CAPSULE | Freq: Four times a day (QID) | ORAL | Status: DC | PRN

## 2021-05-30 MED ORDER — COCONUT OIL OIL: 1.0000 "application " | TOPICAL_OIL | Status: DC | PRN

## 2021-05-30 MED ORDER — MEASLES, MUMPS & RUBELLA VAC IJ SOLR
0.5000 mL | Freq: Once | INTRAMUSCULAR | Status: AC
  Administered 2021-06-01: 0.5 mL via SUBCUTANEOUS
  Filled 2021-05-30: qty 0.5

## 2021-05-30 MED ORDER — FLUCONAZOLE 150 MG PO TABS
150.0000 mg | ORAL_TABLET | Freq: Once | ORAL | Status: AC
Start: 2021-05-30 — End: 2021-05-30
  Administered 2021-05-30: 150 mg via ORAL
  Filled 2021-05-30: qty 1

## 2021-05-30 MED ORDER — SENNOSIDES-DOCUSATE SODIUM 8.6-50 MG PO TABS
2.0000 | ORAL_TABLET | Freq: Every day | ORAL | Status: DC
  Administered 2021-05-31 – 2021-06-01 (×2): 2 via ORAL
  Filled 2021-05-30 (×2): qty 2

## 2021-05-30 MED ORDER — WITCH HAZEL-GLYCERIN EX PADS: 1.0000 "application " | MEDICATED_PAD | CUTANEOUS | Status: DC | PRN

## 2021-05-30 MED ORDER — SIMETHICONE 80 MG PO CHEW: 80.0000 mg | CHEWABLE_TABLET | ORAL | Status: DC | PRN

## 2021-05-30 NOTE — Lactation Note (Signed)
This note was copied from a baby's chart. Lactation Consultation Note  Patient Name: Julie Finley Today's Date: 05/30/2021 Reason for consult: L&D Initial assessment;Early term 37-38.6wks Age:26 hours   L&D Initial Consult:  Visited with family < 1 hour after birth RN assisting with latch.  Mother denied pain.  Reassured mother that lactation services will be available on the M/B unit.  Father present.  Allowed time for family bonding.   Maternal Data    Feeding Mother's Current Feeding Choice: Breast Milk  LATCH Score                    Lactation Tools Discussed/Used    Interventions Interventions: Skin to skin  Discharge    Consult Status Consult Status: Follow-up from L&D    Irene Pap Darlette Dubow 05/30/2021, 7:31 AM

## 2021-05-30 NOTE — Progress Notes (Signed)
FHT improved after amnioinfusion and Pitocin break. Continues to have intermittent late decels. Will restart Pitocin and assess fetal tolerance of contractions. Will continue to closely monitor. Cat 2 tracing at this time.   Evalina Field, MD

## 2021-05-30 NOTE — Progress Notes (Signed)
Labor Progress Note Julie Finley is a 25 y.o. G3P2002 at [redacted]w[redacted]d who presented for IOL due to gHTN.  S: Resting comfortably. No concerns. No pain or pressure with epidural in place.   O:  BP (!) 106/52   Pulse 67   Temp 97.8 F (36.6 C) (Oral)   Resp 18   Ht 5\' 6"  (1.676 m)   Wt 118.4 kg   LMP 08/13/2020 (Approximate)   SpO2 97%   BMI 42.13 kg/m   EFM: Baseline 130 bpm, moderate variability, + accels, no decels  Contractions: Every 8-11 minutes  CVE: Dilation: 5.5 Effacement (%): 80 Cervical Position: Posterior Station: -2 Presentation: Vertex (reassessed by MD to rule out prolapsed cord) Exam by:: Dr. [email or web address]   A&P: 25 y.o. 30 [redacted]w[redacted]d   #Labor: Cervix rechecked. More effaced and slightly more dilated. Fetal head better applied to cervix. FHT improved from prior. Will plan to restart Pitocin and reassess.  #Pain: Epidural  #FWB: Cat 1 #GBS positive > PCN  #gHTN: Normal range BP. No symptoms. Will continue to monitor.  [redacted]w[redacted]d, MD 4:45 AM

## 2021-05-30 NOTE — Lactation Note (Signed)
This note was copied from a baby's chart. Lactation Consultation Note  Patient Name: Julie Finley Today's Date: 05/30/2021 Reason for consult: Initial assessment;Early term 37-38.6wks Age:26 hours   P3 mother whose infant is now 73 hours old.  This is an ETI at 37+2 weeks.  Mother breast fed her first child (now 72 years old) for 1 month and her second child (now 26 years old) for 6 months.  Baby was swaddled and asleep in the bassinet.  Mother recently attempted to breast feed, however, baby was too sleepy.  Reviewed breast feeding basics.  Encouraged to feed at least every three hours due to gestational age and any time baby shows feeding cues. Mother will continue hand expression; no review needed. Suggested mother call her RN/LC for latch assistance as desired.  Mom made aware of O/P services, breastfeeding support groups, community resources, and our phone # for post-discharge questions.  Father present.   Maternal Data Has patient been taught Hand Expression?: Yes Does the patient have breastfeeding experience prior to this delivery?: Yes How long did the patient breastfeed?: 1 month with first child and 6 months with second child  Feeding Mother's Current Feeding Choice: Breast Milk  LATCH Score Latch: Grasps breast easily, tongue down, lips flanged, rhythmical sucking.  Audible Swallowing: A few with stimulation  Type of Nipple: Everted at rest and after stimulation  Comfort (Breast/Nipple): Soft / non-tender  Hold (Positioning): Assistance needed to correctly position infant at breast and maintain latch.  LATCH Score: 8   Lactation Tools Discussed/Used    Interventions Interventions: Education;Breast feeding basics reviewed  Discharge Pump: Personal (Ordered a pump through her insurance) WIC Program: No  Consult Status Consult Status: Follow-up Date: 05/31/21 Follow-up type: In-patient    Julie Finley 05/30/2021, 11:04 AM

## 2021-05-30 NOTE — Anesthesia Postprocedure Evaluation (Signed)
Anesthesia Post Note  Patient: Julie Finley  Procedure(s) Performed: AN AD HOC LABOR EPIDURAL     Patient location during evaluation: Mother Baby Anesthesia Type: Epidural Level of consciousness: awake and alert and oriented Pain management: satisfactory to patient Vital Signs Assessment: post-procedure vital signs reviewed and stable Respiratory status: spontaneous breathing and nonlabored ventilation Cardiovascular status: stable Postop Assessment: no headache, no backache, no signs of nausea or vomiting, adequate PO intake and patient able to bend at knees (patient up walking) Anesthetic complications: no   No notable events documented.  Last Vitals:  Vitals:   05/30/21 1035 05/30/21 1505  BP: 140/81 117/78  Pulse: 88 67  Resp: 20 20  Temp:  36.8 C  SpO2:      Last Pain:  Vitals:   05/30/21 1708  TempSrc:   PainSc: 0-No pain   Pain Goal:                   Kate Larock

## 2021-05-30 NOTE — Discharge Summary (Signed)
Postpartum Discharge Summary   Patient Name: Julie Finley DOB: December 23, 1995 MRN: [id number]  Date of admission: 05/28/2021 Delivery date:05/30/2021  Delivering provider: Genia Del  Date of discharge: 06/01/2021  Admitting diagnosis: Gestational hypertension [O13.9] Intrauterine pregnancy: [redacted]w[redacted]d    Secondary diagnosis:  Principal Problem:   Vaginal delivery Active Problems:   Gestational hypertension   Supervision of high-risk pregnancy   Rubella non-immune status, antepartum   GBS (group B Streptococcus carrier), +RV culture, currently pregnant  Additional problems: None    Discharge diagnosis: Term Pregnancy Delivered                                              Post partum procedures: None Augmentation: AROM, Pitocin, Cytotec, and IP Foley Complications: None  Hospital course: Induction of Labor With Vaginal Delivery   25y.o. yo G3P3003 at 355w2das admitted to the hospital 05/28/2021 for induction of labor.  Indication for induction: Gestational hypertension.  Patient started her induction with Cytotec and foley balloon placement followed by Pitocin and AROM. Patient had episodes of variable and late decelerations after AROM, therefore, an IUPC was placed and an amnioinfusion was started. Her Pitocin was intermittently held to allow for fetal recovery. She was eventually able to be restarted on Pitocin and progressed to complete. She has an uncomplicated vaginal delivery.  Membrane Rupture Time/Date: 7:54 PM ,05/29/2021   Delivery Method:Vaginal, Spontaneous  Episiotomy: None  Lacerations:  None  Details of delivery can be found in separate delivery note.  Patient had a routine postpartum course and is meeting all milestones. She was started on Procardia 30 mg postpartum for elevated blood pressures with good result. She will be started on a 3-day course of Lasix upon discharge. She will have a blood pressure check in 1 week. Patient is discharged home  06/01/21.  Newborn Data: Birth date:05/30/2021  Birth time:6:46 AM  Gender:Female  Living status:Living  Apgars:7 ,9  Weight:2841 g   Magnesium Sulfate received: No BMZ received: No Rhophylac: N/A MMR: Offered postpartum T-DaP: Given prenatally Flu: No Transfusion: No  Physical exam  Vitals:   05/31/21 0955 05/31/21 1400 05/31/21 2131 06/01/21 0648  BP: 138/82 128/73 134/88 120/69  Pulse: 77 68 79 73  Resp: _0 Temp: 97.9 F (36.6 C) 98.2 F (36.8 C) 98.1 F (36.7 C) 97.7 F (36.5 C)  TempSrc: Oral Oral Oral Axillary  SpO2: 99% 97% 99% 100%  Weight:      Height:       General: alert, cooperative, and no distress Lochia: appropriate Uterine Fundus: firm and below umbilicus  DVT Evaluation: no LE edema or calf tenderness to palpation  Labs: Lab Results  Component Value Date   WBC 8.7 05/28/2021   HGB 12.4 05/28/2021   HCT 38.4 05/28/2021   MCV 81.9 05/28/2021   PLT 314 05/28/2021   CMP Latest Ref Rng & Units 05/28/2021  Glucose 70 - 99 mg/dL 78  BUN 6 - 20 mg/dL 5(L)  Creatinine 0.44 - 1.00 mg/dL 0.58  Sodium 135 - 145 mmol/L 135  Potassium 3.5 - 5.1 mmol/L 4.7  Chloride 98 - 111 mmol/L 108  CO2 22 - 32 mmol/L 19(L)  Calcium 8.9 - 10.3 mg/dL 9.2  Total Protein 6.5 - 8.1 g/dL 5.8(L)  Total Bilirubin 0.3 - 1.2 mg/dL 0.8  Alkaline Phos 38 -  126 U/L 144(H)  AST 15 - 41 U/L 24  ALT 0 - 44 U/L 12   Edinburgh Score: Edinburgh Postnatal Depression Scale Screening Tool 05/30/2021  I have been able to laugh and see the funny side of things. 0  I have looked forward with enjoyment to things. 0  I have blamed myself unnecessarily when things went wrong. 0  I have been anxious or worried for no good reason. 0  I have felt scared or panicky for no good reason. 0  Things have been getting on top of me. 0  I have been so unhappy that I have had difficulty sleeping. 0  I have felt sad or miserable. 0  I have been so unhappy that I have been crying. 0  The  thought of harming myself has occurred to me. 0  Edinburgh Postnatal Depression Scale Total 0     After visit meds:  Allergies as of 06/01/2021   No Known Allergies      Medication List     STOP taking these medications    aspirin 81 MG chewable tablet   valACYclovir 500 MG tablet Commonly known as: VALTREX       TAKE these medications    acetaminophen 500 MG tablet Commonly known as: TYLENOL Take 2 tablets (1,000 mg total) by mouth every 8 (eight) hours as needed (pain).   furosemide 20 MG tablet Commonly known as: Lasix Take 1 tablet (20 mg total) by mouth daily for 3 days.   ibuprofen 600 MG tablet Commonly known as: ADVIL Take 1 tablet (600 mg total) by mouth every 6 (six) hours as needed (pain).   multivitamin-prenatal 27-0.8 MG Tabs tablet Take 1 tablet by mouth daily at 12 noon.   NIFEdipine 30 MG 24 hr tablet Commonly known as: ADALAT CC Take 1 tablet (30 mg total) by mouth daily. Start taking on: June 02, 2021         Discharge home in stable condition Infant Feeding: Bottle and Breast Infant Disposition: home with mother Discharge instruction: per After Visit Summary and Postpartum booklet. Activity: Advance as tolerated. Pelvic rest for 6 weeks.  Diet: routine diet Future Appointments: Future Appointments  Date Time Provider [place]  06/05/2021 10:00 AM [facility] NURSE [facility]  06/26/2021 10:35 AM Aletha Halim, MD [facility]   Follow up Visit: Message sent to [facility] by Dr. Gwenlyn Perking on 05/30/21.   Please schedule this patient for a In person postpartum visit in  4-6 weeks  with the following provider: Any provider. Additional Postpartum F/U: BP check 1 week  High risk pregnancy complicated by: HTN Delivery mode:  Vaginal, Spontaneous  Anticipated Birth Control:  Unsure  06/01/2021 Genia Del, MD

## 2021-05-30 NOTE — Progress Notes (Signed)
   05/30/21 1683  Clinical Encounter Type  Visited With Other (Comment)  Visit Type Code  Referral From Nurse  Consult/Referral To Chaplain   Chaplain Tery Sanfilippo responded to page. Per the nurse chaplain support is not needed. This note was prepared by Deneen Harts, M.Div..  For questions please contact by phone [phone].

## 2021-05-30 NOTE — Progress Notes (Signed)
Labor Progress Note Julie Finley is a 25 y.o. G3P2002 at [redacted]w[redacted]d who presented for IOL due to gHTN.  S: Doing well. More comfortable after epidural. No concerns at this time.   O:  BP 123/62   Pulse 62   Temp 98.7 F (37.1 C) (Oral)   Resp 18   Ht 5\' 6"  (1.676 m)   Wt 118.4 kg   LMP 08/13/2020 (Approximate)   SpO2 99%   BMI 42.13 kg/m   EFM: Baseline 130 bpm, moderate variability, + accels, late decelerations Contractions: Every 9-11 minutes   CVE: Dilation: 5 Effacement (%): 60 Cervical Position: Posterior Station: -2 Presentation: Vertex (reassessed by MD to rule out prolapsed cord) Exam by:: Dr. [email or web address]   A&P: 26 y.o. 30 [redacted]w[redacted]d   #Labor: Late decelerations persistent with contractions. Pitocin stopped. Will given 1L bolus and phenylephrine to help with BP support s/p epidural and reassess. Will allow for full fetal recovery before restarting Pitocin. Discussed with Dr. [redacted]w[redacted]d who is in agreement with plan.  #Pain: Epidural  #FWB: Cat 2 due to late decelerations, continues to have reassuring variability and intermittent accels in between. Will continue to monitor. #GBS positive  Despina Hidden, MD 1:30 AM

## 2021-05-31 NOTE — Progress Notes (Signed)
CSW met with MOB to complete consult for mental health. CSW observed MOB standing at bedside, infant laying on couch, and FOB in restroom. MOB gave CSW verbal consent to complete consult while FOB was present. CSW explained role, and reason for consult. MOB was pleasant, and polite during engagement with CSW. MOB reported, she does not recall mentioning history of anxiety. MOB reported, she did not experience any mental health concerns during pregnancy. MOB denied any history of psychotropic medication, and therapy involvement. CSW offered MOB mental health resources, but MOB decline. CSW encourage MOB to implement healthy coping skills if symptoms arises.   CSW provided education regarding the baby blues period vs. perinatal mood disorders, discussed treatment and gave resources for mental health follow up if concerns arise. CSW recommends self- evaluation during the postpartum time period using the New Mom Checklist from Postpartum Progress and encouraged MOB to contact a medical professional if symptoms are noted at any time.   MOB reported, since delivery she feels, "good". MOB reported, her family is very supportive. MOB denied SI, and HI when CSW assessed for safety.   MOB reported, there are no transportation barriers to follow up infant's care. MOB reported, she has all essentials needed to care for infant. MOB reported, infant has a car seat, and bassinet. MOB denied any additional barriers.     CSW provided education on sudden infant death syndrome (SIDS).  CSW identifies no further need for intervention or barriers to discharge at this time.   Darcus Austin, MSW, LCSW-A Clinical Social Worker- Weekends [phone]

## 2021-05-31 NOTE — Progress Notes (Signed)
Post Partum Day 1  Subjective: Doing well. No acute events overnight. Pain is controlled and bleeding is appropriate. She is eating, drinking, voiding, and ambulating without issue. She is breast feeding which is going okay. She has no other concerns at this time.  Objective: Blood pressure 119/65, pulse 67, temperature 98 F (36.7 C), temperature source Oral, resp. rate 18, height 5\' 6"  (1.676 m), weight 118.4 kg, last menstrual period 08/13/2020, SpO2 97 %, unknown if currently breastfeeding.  Physical Exam:  General: alert, cooperative, and no distress Lochia: appropriate Uterine Fundus: firm Extremities: No edema or calf tenderness.  Recent Labs    05/28/21 2029  HGB 12.4  HCT 38.4    Assessment/Plan: Julie Finley is a 25 y.o. G3P003 on PPD# 1 s/p SVD.  Progressing well. Meeting postpartum milestones. VSS. Continue routine postpartum care.  Feeding: breast Contraception: undecided  Dispo: Plan for discharge tomorrow   LOS: 3 days   30, MD  05/31/2021, 8:03 AM

## 2021-05-31 NOTE — Lactation Note (Signed)
This note was copied from a baby's chart. Lactation Consultation Note  Patient Name: Girl Codi Folkerts [id number] Date: 05/31/2021 Reason for consult: Follow-up assessment;Early term 37-38.6wks;Hyperbilirubinemia Age:26 hours  Mom confidently able to latch baby. Very few sucks noted. Mom encouraged to bottle feed after breastfeed.   Mom bottlefed and milk seemed to leak from corners of mouth. LC assisted with bottle feed and noted the same. Baby thrusts tongue when feeding from bottle. Suck training done. A few sucks noted with bottle feeding and infant seemed to struggle with coordination. Parents encouraged to call out for RN for next bottle feed. If infant continues to struggle with bottle feeding, RN will place SLP order.   LC reviewed milk storage guidelines and that milk needs to be discarded after 1 hour after baby drinks from bottle. Family was encouraged to do paced bottle feeding and guidelines provided for supplementation.   Mom encouraged to pump 8x/24 hour for stimulation and can feed milk back to baby.   Maternal Data Has patient been taught Hand Expression?: Yes Does the patient have breastfeeding experience prior to this delivery?: Yes How long did the patient breastfeed?: 4 weeks and 6 months with 26 year old and 4 year told  Feeding Mother's Current Feeding Choice: Breast Milk and Formula Nipple Type: Nfant Standard Flow (white)  LATCH Score Latch: Repeated attempts needed to sustain latch, nipple held in mouth throughout feeding, stimulation needed to elicit sucking reflex.  Audible Swallowing: A few with stimulation  Type of Nipple: Everted at rest and after stimulation  Comfort (Breast/Nipple): Soft / non-tender  Hold (Positioning): Assistance needed to correctly position infant at breast and maintain latch.  LATCH Score: 7   Lactation Tools Discussed/Used    Interventions    Discharge Pump: DEBP;Personal (Mom's pump is being ordered through  insurance)  Consult Status Consult Status: Follow-up Date: 06/01/21 Follow-up type: In-patient    Shana Chute 05/31/2021, 1:31 PM

## 2021-05-31 NOTE — Progress Notes (Signed)
Post Partum Day 1 Subjective: no complaints, up ad lib, voiding, and tolerating PO  Objective: Blood pressure 138/82, pulse 77, temperature 97.9 F (36.6 C), temperature source Oral, resp. rate 17, height 5\' 6"  (1.676 m), weight 118.4 kg, last menstrual period 08/13/2020, SpO2 99 %, unknown if currently breastfeeding.  Physical Exam:  General: alert and cooperative Lochia: appropriate Uterine Fundus: firm Incision: n/a DVT Evaluation: No evidence of DVT seen on physical exam.  Recent Labs    05/28/21 2029  HGB 12.4  HCT 38.4    Assessment/Plan: Plan for discharge tomorrow   LOS: 3 days   2030 05/31/2021, 10:40 AM

## 2021-05-31 NOTE — Plan of Care (Signed)
  Problem: Education: Goal: Knowledge of Childbirth will improve Outcome: Completed/Met   Problem: Coping: Goal: Ability to verbalize concerns and feelings about labor and delivery will improve Outcome: Completed/Met   Problem: Life Cycle: Goal: Ability to make normal progression through stages of labor will improve Outcome: Completed/Met   Problem: Role Relationship: Goal: Will demonstrate positive interactions with the child Outcome: Completed/Met   Problem: Safety: Goal: Risk of complications during labor and delivery will decrease Outcome: Completed/Met   Problem: Pain Management: Goal: Relief or control of pain from uterine contractions will improve Outcome: Completed/Met   Problem: Education: Goal: Knowledge of General Education information will improve Description: Including pain rating scale, medication(s)/side effects and non-pharmacologic comfort measures Outcome: Completed/Met   Problem: Health Behavior/Discharge Planning: Goal: Ability to manage health-related needs will improve Outcome: Completed/Met   Problem: Clinical Measurements: Goal: Ability to maintain clinical measurements within normal limits will improve Outcome: Completed/Met Goal: Will remain free from infection Outcome: Completed/Met Goal: Diagnostic test results will improve Outcome: Completed/Met Goal: Respiratory complications will improve Outcome: Completed/Met Goal: Cardiovascular complication will be avoided Outcome: Completed/Met   Problem: Activity: Goal: Risk for activity intolerance will decrease Outcome: Completed/Met   Problem: Nutrition: Goal: Adequate nutrition will be maintained Outcome: Completed/Met   Problem: Elimination: Goal: Will not experience complications related to bowel motility Outcome: Completed/Met   Problem: Pain Managment: Goal: General experience of comfort will improve Outcome: Completed/Met   Problem: Safety: Goal: Ability to remain free from  injury will improve Outcome: Completed/Met   Problem: Skin Integrity: Goal: Risk for impaired skin integrity will decrease Outcome: Completed/Met   Problem: Education: Goal: Knowledge of condition will improve Outcome: Completed/Met   Problem: Activity: Goal: Ability to tolerate increased activity will improve Outcome: Completed/Met   Problem: Life Cycle: Goal: Chance of risk for complications during the postpartum period will decrease Outcome: Completed/Met

## 2021-05-31 NOTE — Progress Notes (Signed)
The Rn called Dr Mathis Fare regarding the patient's Blood pressure of 140/64  and stated that [facility] assessment was WNL. Rn mentioned that Blood pressures during the day had been elevated 9 am 142/78 and 1035 am at 140/81.  Dr Mathis Fare ordered procardia and said to notify her if BPs were equal to or greater than 160/110. In addition , to notify the Doctor if PIH symptoms start to develop. The Rn informed the Dr that with th patient's last delivery  the patient had to be admitted to ICU due high blood pressure.

## 2021-05-31 NOTE — Plan of Care (Signed)
  Problem: Coping: Goal: Level of anxiety will decrease Outcome: Progressing   Problem: Elimination: Goal: Will not experience complications related to urinary retention Outcome: Progressing   Problem: Role Relationship: Goal: Ability to demonstrate positive interaction with newborn will improve Outcome: Progressing   Problem: Skin Integrity: Goal: Demonstration of wound healing without infection will improve Outcome: Progressing   Problem: Education: Goal: Ability to make informed decisions regarding treatment and plan of care will improve Outcome: Completed/Met Goal: Ability to state and carry out methods to decrease the pain will improve Outcome: Completed/Met Goal: Individualized Educational Video(s) Outcome: Completed/Met   Problem: Life Cycle: Goal: Ability to effectively push during vaginal delivery will improve Outcome: Completed/Met   Problem: Activity: Goal: Will verbalize the importance of balancing activity with adequate rest periods Outcome: Completed/Met

## 2021-06-01 MED ORDER — NIFEDIPINE ER 30 MG PO TB24: 30.0000 mg | ORAL_TABLET | Freq: Every day | ORAL | 0 refills | Status: DC

## 2021-06-01 MED ORDER — IBUPROFEN 600 MG PO TABS
600.0000 mg | ORAL_TABLET | Freq: Four times a day (QID) | ORAL | 0 refills | Status: DC | PRN
Start: 2021-06-01 — End: 2022-05-25

## 2021-06-01 MED ORDER — ACETAMINOPHEN 500 MG PO TABS
1000.0000 mg | ORAL_TABLET | Freq: Three times a day (TID) | ORAL | 0 refills | Status: DC | PRN
Start: 2021-06-01 — End: 2022-08-28

## 2021-06-01 MED ORDER — FUROSEMIDE 20 MG PO TABS: 20.0000 mg | ORAL_TABLET | Freq: Every day | ORAL | 0 refills | Status: DC

## 2021-06-01 NOTE — Lactation Note (Signed)
This note was copied from a baby's chart. Lactation Consultation Note  Patient Name: Julie Finley Date: 06/01/2021 Reason for consult: Follow-up assessment;Early term 37-38.6wks Age:26 hours   P3 mother whose infant is now 73 hours old.  This is an ETI at 37+2 weeks.  Mother breast fed her first child (now 22 years old) for 1 month and her second child (now 50 years old) for 6 months.  Mother had an SLP consult and reported that the new nipple she is using is working very well for her daughter.  She is breast feeding and supplementing with her EBM/formula to required volumes.  Mother had no questions/concerns at this time.  Feeding plan for after discharge established.  Mother has approximately 8 ounces of EBM in the refrigerator.  Praised her for her efforts and reminded to always feed her EBM prior to giving any formula supplementation.  Mother has our OP phone number fur any questions after discharge.  Family has been discharged.   Maternal Data    Feeding    LATCH Score                    Lactation Tools Discussed/Used    Interventions Interventions: Education  Discharge Discharge Education: Engorgement and breast care Pump: Personal  Consult Status Consult Status: Complete Date: 06/01/21 Follow-up type: Call as needed    Irene Pap Aryan Bello 06/01/2021, 5:28 PM

## 2021-06-05 ENCOUNTER — Encounter: Payer: Self-pay | Admitting: *Deleted

## 2021-06-05 ENCOUNTER — Ambulatory Visit: Payer: Self-pay

## 2021-06-12 ENCOUNTER — Telehealth (HOSPITAL_COMMUNITY): Payer: Self-pay | Admitting: *Deleted

## 2021-06-12 NOTE — Telephone Encounter (Signed)
Phone voicemail message left to return nurse call.  Duffy Rhody, RN 06-12-2021 at 2:39pm

## 2021-06-26 ENCOUNTER — Ambulatory Visit: Payer: Self-pay | Admitting: Obstetrics and Gynecology

## 2021-07-01 ENCOUNTER — Encounter: Payer: Self-pay | Admitting: *Deleted

## 2021-07-01 ENCOUNTER — Telehealth: Payer: Self-pay | Admitting: *Deleted

## 2021-07-01 NOTE — Telephone Encounter (Signed)
Received fax request from [facility] for refill of Nifedipine 30 mg.  Per chart review, pt delivered baby on 05/30/21.  She DNKA on 12/9 for BP check and also DNKA on 12/30 for postpartum care. Her BP status in unknown. Pt was called today and a voicemail message was left stating that we need to speak with her. She may leave a message on nurse voicemail or respond to the Mychart message I will send.

## 2021-07-21 ENCOUNTER — Encounter: Payer: Self-pay | Admitting: Family Medicine

## 2021-07-21 ENCOUNTER — Other Ambulatory Visit: Payer: Self-pay

## 2021-07-21 ENCOUNTER — Ambulatory Visit (INDEPENDENT_AMBULATORY_CARE_PROVIDER_SITE_OTHER): Payer: Medicaid Other | Admitting: Obstetrics and Gynecology

## 2021-07-21 MED ORDER — NORETHIN ACE-ETH ESTRAD-FE 1-20 MG-MCG(24) PO TABS: 1.0000 | ORAL_TABLET | Freq: Every day | ORAL | 3 refills | Status: DC

## 2021-07-21 NOTE — Progress Notes (Signed)
° ° °  Post Partum Visit Note  Julie Finley is a 26 y.o. G3P3003 s/p 12/3 SVD/bilateral labial lacerations (not repaired) at 37/2 after IOL for gHTN. Anesthesia: epidural. Postpartum course has been c/b starting on procardia xl about a week PP. She stopped it aobut a 10 days ago b/c she ran out.Pecola Leisure is doing well. Baby is feeding by breast. Bleeding no bleeding but she thinks she had her period last week. Bowel function is normal. Bladder function is normal. Patient is not sexually active. Contraception method is abstinence. Postpartum depression screening: negative.  Review of Systems Pertinent items noted in HPI and remainder of comprehensive ROS otherwise negative.  Objective:  BP 129/80    Pulse 85    Wt 247 lb (112 kg)    LMP 07/13/2021 (Approximate)    Breastfeeding Yes    BMI 39.87 kg/m   NAD Assessment:   Normal postpartum visit.   Plan:  Routine PP care. Options d/w her and she would like to start on OCPs. Loestrin sent in for her. Fine to stay of BP meds because they are normal now and she had gHTN. [facility] 01/2021 at [facility]  RTC: 1 year  Knobel Bing, MD [facility] [employer], [facility]

## 2021-07-23 ENCOUNTER — Encounter: Payer: Self-pay | Admitting: Obstetrics and Gynecology

## 2022-03-04 ENCOUNTER — Encounter (HOSPITAL_BASED_OUTPATIENT_CLINIC_OR_DEPARTMENT_OTHER): Payer: Self-pay | Admitting: Emergency Medicine

## 2022-03-04 ENCOUNTER — Other Ambulatory Visit: Payer: Self-pay

## 2022-03-04 ENCOUNTER — Emergency Department (HOSPITAL_BASED_OUTPATIENT_CLINIC_OR_DEPARTMENT_OTHER)
Admission: EM | Admit: 2022-03-04 | Discharge: 2022-03-04 | Disposition: A | Discharge status: Home / Self Care | Payer: Medicaid Other | Attending: Emergency Medicine | Admitting: Emergency Medicine

## 2022-03-04 DIAGNOSIS — R0981 Nasal congestion: Secondary | ICD-10-CM | POA: Diagnosis present

## 2022-03-04 DIAGNOSIS — J069 Acute upper respiratory infection, unspecified: Secondary | ICD-10-CM | POA: Insufficient documentation

## 2022-03-04 DIAGNOSIS — Z20822 Contact with and (suspected) exposure to covid-19: Secondary | ICD-10-CM | POA: Diagnosis not present

## 2022-03-04 LAB — SARS CORONAVIRUS 2 BY RT PCR: SARS Coronavirus 2 by RT PCR: NEGATIVE

## 2022-03-04 NOTE — ED Triage Notes (Signed)
Mother with nasal congestion and family member covid positive

## 2022-03-04 NOTE — ED Provider Notes (Signed)
[facility] EMERGENCY DEPARTMENT Provider Note   CSN: [id number] Arrival date & time: 03/04/22  2020     History  Chief Complaint  Patient presents with   URI    Julie Finley is a 26 y.o. female here for evaluation of congestion and rhinorrhea.  Positive COVID exposure.  Tolerating p.o. intake.  No CP, SOB, ABD pain.  Multiple family members with similar symptoms.  HPI     Home Medications Prior to Admission medications   Medication Sig Start Date End Date Taking? Authorizing Provider  acetaminophen (TYLENOL) 500 MG tablet Take 2 tablets (1,000 mg total) by mouth every 8 (eight) hours as needed (pain). 06/01/21   Worthy Rancher, MD  furosemide (LASIX) 20 MG tablet Take 1 tablet (20 mg total) by mouth daily for 3 days. 06/01/21 06/04/21  Worthy Rancher, MD  ibuprofen (ADVIL) 600 MG tablet Take 1 tablet (600 mg total) by mouth every 6 (six) hours as needed (pain). 06/01/21   Worthy Rancher, MD  NIFEdipine (ADALAT CC) 30 MG 24 hr tablet Take 1 tablet (30 mg total) by mouth daily. Patient not taking: Reported on 07/21/2021 06/02/21   Worthy Rancher, MD  Norethindrone Acetate-Ethinyl Estrad-FE (LOESTRIN 24 FE) 1-20 MG-MCG(24) tablet Take 1 tablet by mouth daily. 07/21/21   [place] Bing, MD  Prenatal Vit-Fe Fumarate-FA (MULTIVITAMIN-PRENATAL) 27-0.8 MG TABS tablet Take 1 tablet by mouth daily at 12 noon.    [provider]      Allergies    Patient has no known allergies.    Review of Systems   Review of Systems  Constitutional: Negative.   HENT:  Positive for congestion, postnasal drip and rhinorrhea. Negative for sore throat, trouble swallowing and voice change.   Respiratory: Negative.    Cardiovascular: Negative.   Gastrointestinal: Negative.   Genitourinary: Negative.   Musculoskeletal: Negative.   Skin: Negative.   Neurological: Negative.   All other systems reviewed and are negative.   Physical Exam Updated Vital Signs BP  132/68   Pulse 78   Temp 98.6 F (37 C) (Oral)   Resp 18   Ht 5\' 7"  (1.702 m)   Wt 120.2 kg   SpO2 99%   BMI 41.50 kg/m  Physical Exam Vitals and nursing note reviewed.  Constitutional:      General: She is not in acute distress.    Appearance: She is well-developed. She is not ill-appearing, toxic-appearing or diaphoretic.  HENT:     Head: Normocephalic and atraumatic.     Nose: Congestion and rhinorrhea (clear) present.  Eyes:     Pupils: Pupils are equal, round, and reactive to light.  Cardiovascular:     Rate and Rhythm: Normal rate.     Pulses: Normal pulses.     Heart sounds: Normal heart sounds.  Pulmonary:     Effort: Pulmonary effort is normal. No respiratory distress.     Breath sounds: Normal breath sounds.  Abdominal:     General: There is no distension.  Musculoskeletal:        General: Normal range of motion.     Cervical back: Normal range of motion.  Skin:    General: Skin is warm and dry.     Capillary Refill: Capillary refill takes less than 2 seconds.  Neurological:     General: No focal deficit present.     Mental Status: She is alert and oriented to person, place, and time.  Psychiatric:  Mood and Affect: Mood normal.     ED Results / Procedures / Treatments   Labs (all labs ordered are listed, but only abnormal results are displayed) Labs Reviewed  SARS CORONAVIRUS 2 BY RT PCR    EKG None  Radiology No results found.  Procedures Procedures    Medications Ordered in ED Medications - No data to display  ED Course/ Medical Decision Making/ A&P    26 year old here for evaluation of congestion and rhinorrhea.  Family member with positive COVID.  She appears otherwise well, clinical hydrated.  No neck stiffness neck rigidity.  Heart and lungs clear but abdomen soft, nontender, tolerating p.o. intake.  Labs personally viewed and interpreted: COVID-negative  Given patient symptomatic with close exposure discussed treating  symptomatically, isolate until 5 days post exposure which she is agreeable for.  Discussed OTC medications to help with symptoms.  The patient has been appropriately medically screened and/or stabilized in the ED. I have low suspicion for any other emergent medical condition which would require further screening, evaluation or treatment in the ED or require inpatient management.  Patient is hemodynamically stable and in no acute distress.  Patient able to ambulate in department prior to ED.  Evaluation does not show acute pathology that would require ongoing or additional emergent interventions while in the emergency department or further inpatient treatment.  I have discussed the diagnosis with the patient and answered all questions.  Pain is been managed while in the emergency department and patient has no further complaints prior to discharge.  Patient is comfortable with plan discussed in room and is stable for discharge at this time.  I have discussed strict return precautions for returning to the emergency department.  Patient was encouraged to follow-up with PCP/specialist refer to at discharge.                            Medical Decision Making Amount and/or Complexity of Data Reviewed Independent Historian:     Details: children External Data Reviewed: labs and notes. Labs: ordered. Decision-making details documented in ED Course.  Risk OTC drugs. Decision regarding hospitalization. Diagnosis or treatment significantly limited by social determinants of health.          Final Clinical Impression(s) / ED Diagnoses Final diagnoses:  Viral URI with cough  Close exposure to COVID-19 virus    Rx / DC Orders ED Discharge Orders     None         Thelia Tanksley A, PA-C 03/04/22 2349    Melene Plan, DO 03/05/22 1455

## 2022-04-13 IMAGING — US US RENAL
1 series · 15 of 25 positions shown · non-contrast
Comparison: None.

CLINICAL DATA: Right flank pain

EXAM:
RENAL / URINARY TRACT ULTRASOUND COMPLETE

[Series 1: us renal · 100 acquisitions, 15 frames shown]
[im 1/100]
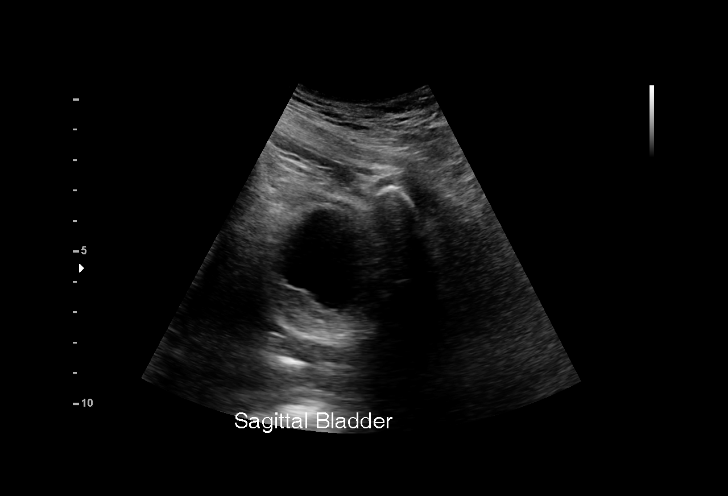
[im 9/100]
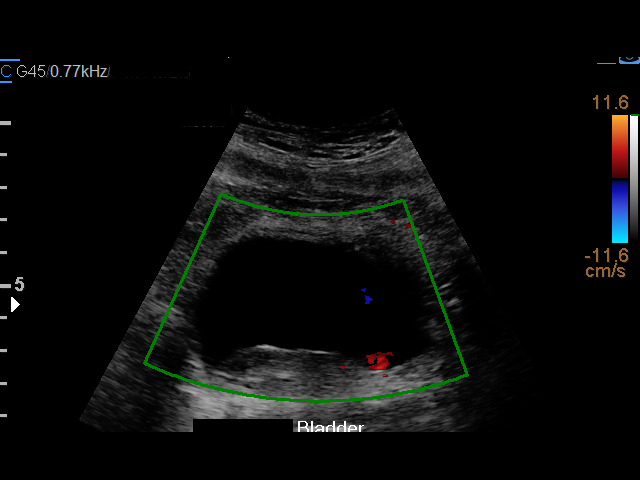
[im 17/100]
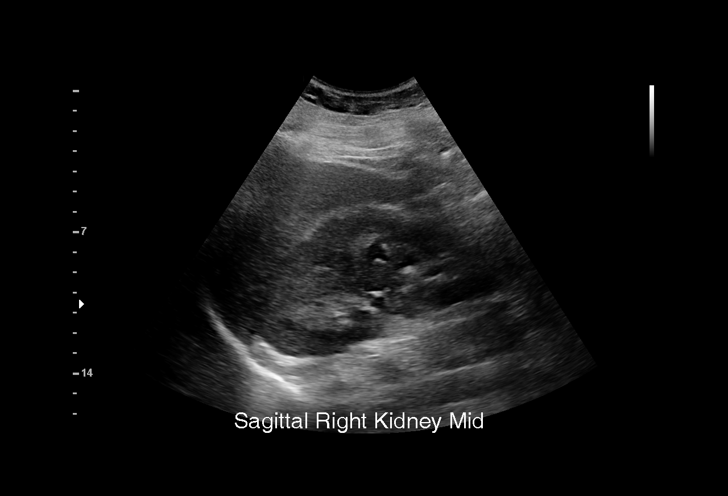
[im 21/100]
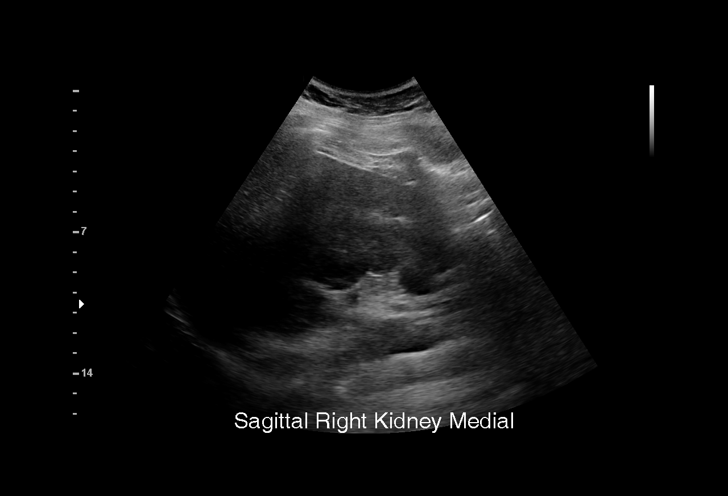
[im 29/100]
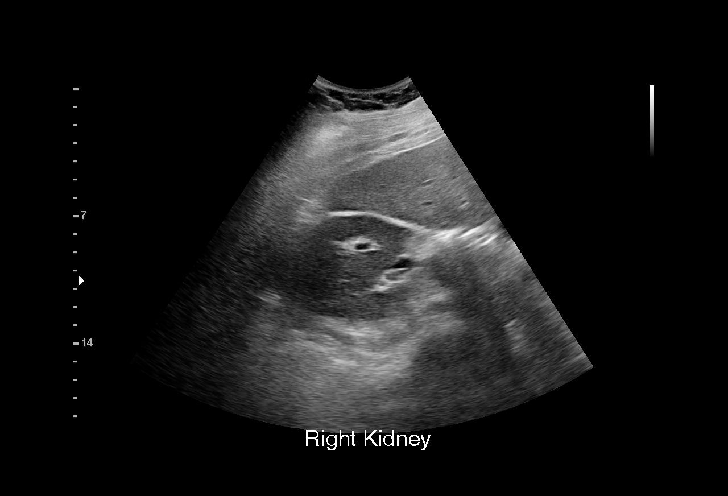
[im 38/100]
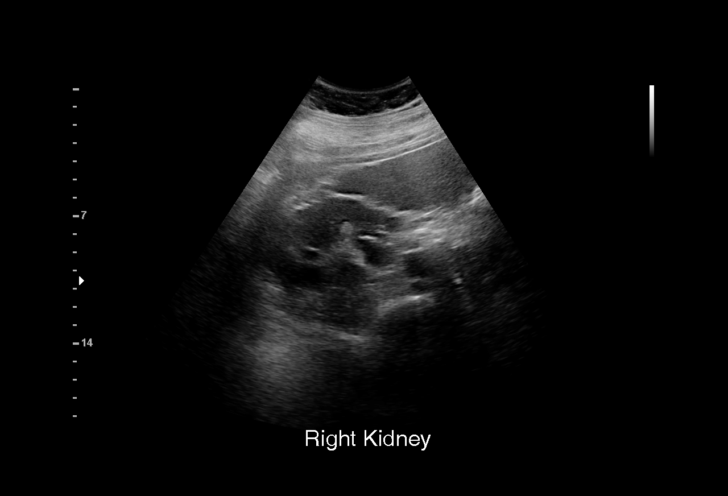
[im 42/100]
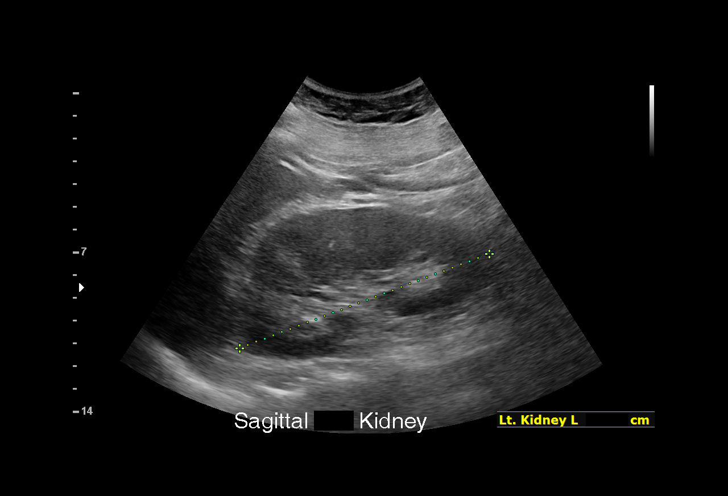
[im 50/100]
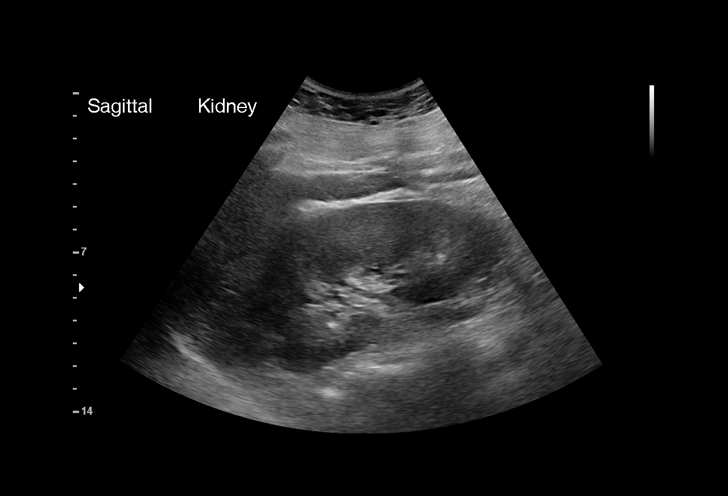
[im 58/100]
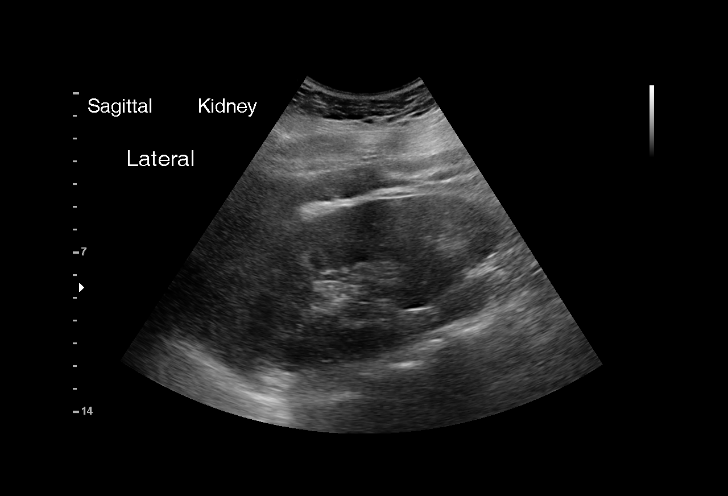
[im 62/100]
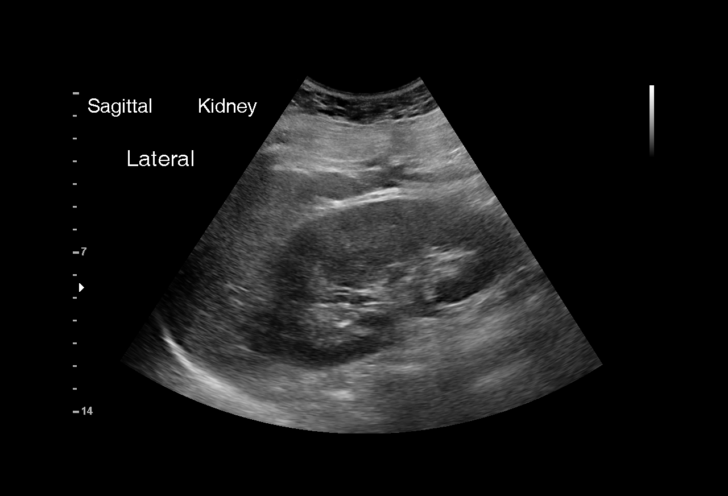
[im 71/100]
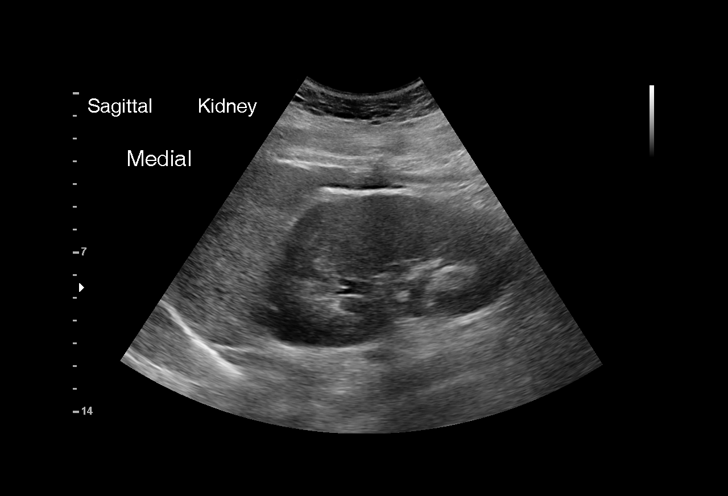
[im 79/100]
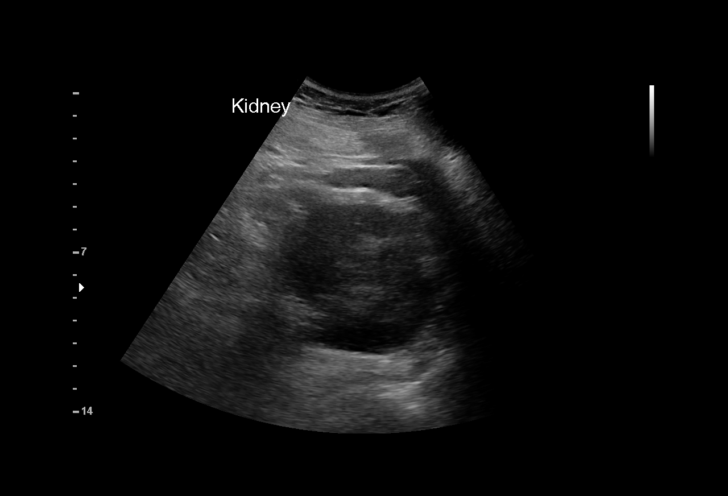
[im 83/100]
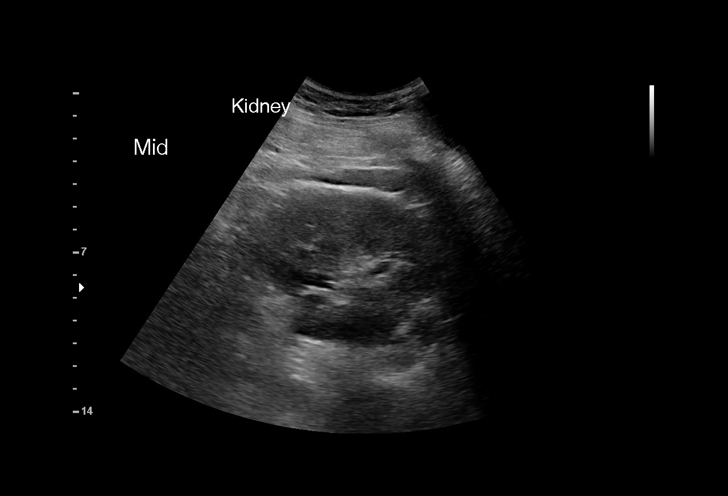
[im 91/100]
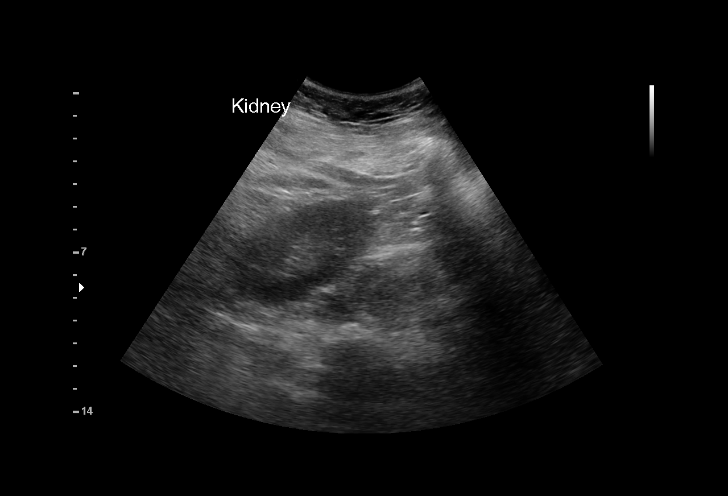
[im 100/100]
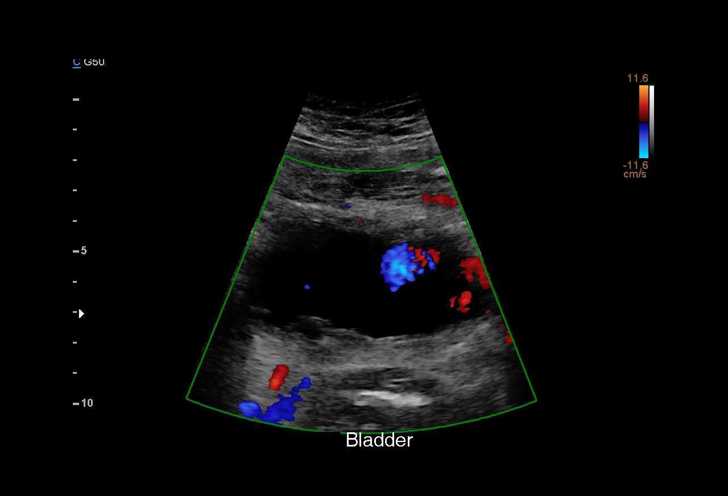

[15 of 25 positions shown; findings below may reference images not displayed]

FINDINGS: Right Kidney:

Renal measurements: 13.3 by 6.0 by 5.7 cm = volume: 237 mL.
Echogenicity within normal limits. No solid mass identified.
Borderline right hydronephrosis noted.

Left Kidney:

Renal measurements: 11.7 by 5.7 by 7.4 cm = volume: 258 mL.
Echogenicity within normal limits. No mass or hydronephrosis
visualized.

Bladder:

Appears normal for degree of bladder distention. Prevoid volume 34
cc, volume after voiding attempt 33 cc.

Other:

None.
IMPRESSION: 1. Borderline right hydronephrosis. Otherwise normal sonographic
appearance of the kidneys and urinary bladder.

## 2022-05-04 ENCOUNTER — Ambulatory Visit (INDEPENDENT_AMBULATORY_CARE_PROVIDER_SITE_OTHER): Payer: Medicaid Other

## 2022-05-04 DIAGNOSIS — O285 Abnormal chromosomal and genetic finding on antenatal screening of mother: Secondary | ICD-10-CM | POA: Insufficient documentation

## 2022-05-04 DIAGNOSIS — Z8759 Personal history of other complications of pregnancy, childbirth and the puerperium: Secondary | ICD-10-CM | POA: Insufficient documentation

## 2022-05-04 DIAGNOSIS — Z3201 Encounter for pregnancy test, result positive: Secondary | ICD-10-CM | POA: Diagnosis not present

## 2022-05-04 DIAGNOSIS — Z3491 Encounter for supervision of normal pregnancy, unspecified, first trimester: Secondary | ICD-10-CM

## 2022-05-04 DIAGNOSIS — Z3200 Encounter for pregnancy test, result unknown: Secondary | ICD-10-CM

## 2022-05-04 HISTORY — DX: Personal history of other complications of pregnancy, childbirth and the puerperium: Z87.59

## 2022-05-04 LAB — POCT PREGNANCY, URINE: Preg Test, Ur: POSITIVE — AB

## 2022-05-04 NOTE — Progress Notes (Addendum)
Possible Pregnancy  Here today to leave urine sample for pregnancy confirmation. UPT in office today is positive. Called pt with results; VM left.  Julie Howells, RN 05/04/2022  3:54 PM   Addendum:  Patient returned phone call. Reviewed dating with patient:   LMP: 04/02/22 EDD: 01/07/23 4w 4d today  Pt reports she has very irregular periods. States prior to last period she would sometimes have bleeding every few weeks, but sometimes would not have a period for a few months. Patient will be scheduled for dating [place] to confirm. OB history reviewed; 3 full term vaginal deliveries. Reviewed medications and allergies with patient; list of medications safe to take during pregnancy given.  Recommended pt begin prenatal vitamin and schedule prenatal care.  Julie Howells, RN 05/04/2022  4:34 PM

## 2022-05-04 NOTE — Patient Instructions (Signed)

## 2022-05-04 NOTE — Addendum Note (Signed)
Addended by: Louisa Second E on: 05/04/2022 04:44 PM   Modules accepted: Orders

## 2022-05-25 ENCOUNTER — Other Ambulatory Visit: Payer: Self-pay | Admitting: Obstetrics and Gynecology

## 2022-05-25 ENCOUNTER — Ambulatory Visit (INDEPENDENT_AMBULATORY_CARE_PROVIDER_SITE_OTHER): Payer: Medicaid Other

## 2022-05-25 ENCOUNTER — Encounter: Payer: Self-pay | Admitting: Advanced Practice Midwife

## 2022-05-25 ENCOUNTER — Ambulatory Visit (INDEPENDENT_AMBULATORY_CARE_PROVIDER_SITE_OTHER): Payer: Medicaid Other | Admitting: Advanced Practice Midwife

## 2022-05-25 VITALS — BP 133/73

## 2022-05-25 DIAGNOSIS — Z3491 Encounter for supervision of normal pregnancy, unspecified, first trimester: Secondary | ICD-10-CM

## 2022-05-25 DIAGNOSIS — O09899 Supervision of other high risk pregnancies, unspecified trimester: Secondary | ICD-10-CM | POA: Insufficient documentation

## 2022-05-25 DIAGNOSIS — Z3200 Encounter for pregnancy test, result unknown: Secondary | ICD-10-CM

## 2022-05-25 DIAGNOSIS — Z8759 Personal history of other complications of pregnancy, childbirth and the puerperium: Secondary | ICD-10-CM | POA: Diagnosis not present

## 2022-05-25 DIAGNOSIS — O09892 Supervision of other high risk pregnancies, second trimester: Secondary | ICD-10-CM

## 2022-05-25 DIAGNOSIS — A6004 Herpesviral vulvovaginitis: Secondary | ICD-10-CM

## 2022-05-25 DIAGNOSIS — Z3A25 25 weeks gestation of pregnancy: Secondary | ICD-10-CM | POA: Diagnosis not present

## 2022-05-25 DIAGNOSIS — O0990 Supervision of high risk pregnancy, unspecified, unspecified trimester: Secondary | ICD-10-CM | POA: Insufficient documentation

## 2022-05-25 DIAGNOSIS — O09292 Supervision of pregnancy with other poor reproductive or obstetric history, second trimester: Secondary | ICD-10-CM | POA: Diagnosis not present

## 2022-05-25 DIAGNOSIS — A6000 Herpesviral infection of urogenital system, unspecified: Secondary | ICD-10-CM | POA: Insufficient documentation

## 2022-05-25 MED ORDER — ASPIRIN 81 MG PO TBEC: 81.0000 mg | DELAYED_RELEASE_TABLET | Freq: Every day | ORAL | 2 refills | Status: DC

## 2022-05-25 NOTE — Progress Notes (Signed)
Ultrasounds Results Note  SUBJECTIVE HPI:  Ms. Julie Finley is a 26 y.o. G3P3003 at Unknown by  irreg, unsure LMP  who presents to [facility] for followup ultrasound results. The patient denies abdominal pain or vaginal bleeding.    --/--/O POS (12/01 2030)  Repeat ultrasound was performed earlier today.   Past Medical History:  Diagnosis Date   Anxiety    GBS (group B Streptococcus carrier), +RV culture, currently pregnant 05/28/2021   Genital herpes    Gestational hypertension 05/04/2021   Medical history non-contributory    Post term pregnancy at [redacted] weeks gestation 08/21/2016   Pregnancy induced hypertension    had postpartum magnesium   Pyelonephritis 2016   Vaginal delivery 12/07/2014   Past Surgical History:  Procedure Laterality Date   NO PAST SURGERIES     Social History   Socioeconomic History   Marital status: Single    Spouse name: Not on file   Number of children: Not on file   Years of education: Not on file   Highest education level: Not on file  Occupational History   Occupation: housekeeper  Tobacco Use   Smoking status: Former    Packs/day: 0.50    Types: Cigarettes   Smokeless tobacco: Never  Vaping Use   Vaping Use: Never used  Substance and Sexual Activity   Alcohol use: Not Currently   Drug use: No   Sexual activity: Yes    Birth control/protection: None  Other Topics Concern   Not on file  Social History Narrative   Not on file   Social Determinants of Health   Financial Resource Strain: Not on file  Food Insecurity: No Food Insecurity (05/25/2021)   Hunger Vital Sign    Worried About Running Out of Food in the Last Year: Never true    Ran Out of Food in the Last Year: Never true  Transportation Needs: No Transportation Needs (05/25/2021)   PRAPARE - Administrator, Civil Service (Medical): No    Lack of Transportation (Non-Medical): No  Physical Activity: Not on file  Stress: Not on file  Social  Connections: Not on file  Intimate Partner Violence: Not on file   Current Outpatient Medications on File Prior to Visit  Medication Sig Dispense Refill   acetaminophen (TYLENOL) 500 MG tablet Take 2 tablets (1,000 mg total) by mouth every 8 (eight) hours as needed (pain). 60 tablet 0   NIFEdipine (ADALAT CC) 30 MG 24 hr tablet Take 1 tablet (30 mg total) by mouth daily. (Patient not taking: Reported on 07/21/2021) 30 tablet 0   Prenatal Vit-Fe Fumarate-FA (MULTIVITAMIN-PRENATAL) 27-0.8 MG TABS tablet Take 1 tablet by mouth daily at 12 noon.     No current facility-administered medications on file prior to visit.   No Known Allergies  I have reviewed patient's Past Medical Hx, Surgical Hx, Family Hx, Social Hx, medications and allergies.   Review of Systems Review of Systems  Constitutional: Negative for fever and chills.  Gastrointestinal: Negative for abdominal pain.  Genitourinary: Negative for vaginal bleeding.  Musculoskeletal: Negative for back pain.  Neurological: Negative for dizziness and weakness.    Physical Exam  BP 133/73   LMP  (Approximate)   No LMP recorded (approximate). Patient is pregnant. GENERAL: Well-developed, well-nourished female in no acute distress.  HEENT: Normocephalic, atraumatic.   LUNGS: Effort normal ABDOMEN: Deferred HEART: Regular rate  SKIN: Warm, dry and without erythema PSYCH: Normal mood and affect NEURO: Alert and oriented  x 4  LAB RESULTS No results found for this or any previous visit (from the past 24 hour(s)).  IMAGING 25.0 week live [place] 1. Pregnancy with uncertain dates in first trimester  2. Supervision of other high risk pregnancies, second trimester - Very interested in CenteringPregnancy. Will see if she can be added to group 8.   3. [redacted] weeks gestation of pregnancy   4. History of pre-eclampsia in prior pregnancy, currently pregnant in second trimester  - aspirin EC 81 MG tablet; Take 1 tablet (81 mg  total) by mouth daily. Take after 12 weeks for prevention of preeclampssia later in pregnancy  Dispense: 300 tablet; Refill: 2  5. Herpes simplex vulvovaginitis - Valtrex 36 weeks   PLAN Start prenatal care with provider of your choice Needs Baseline PCR, Creatinine  Patient advised to start taking prenatal vitamins Go to MAU as needed for heavy bleeding, abdominal pain or fever greater than 100.4.  [place], CNM 05/25/2022 1:46 PM

## 2022-05-25 NOTE — Patient Instructions (Signed)
  CenteringPregnancy is a model of prenatal care that started 30 years ago and is used in about 600 practices around the [place]. You meet with a group of 8-12 women due around the same time as you. In Centering you will have individual time with the provider and meet as a group. There's much more time for discussion and learning. You will actually have much more time with your provider in Centering than in traditional prenatal care.? You will come directly into the Centering room and will not wait in the lobby so there is no wasted time. You will have 2-hour visits every 4 weeks then every 2 weeks. You will know your Centering prenatal appointments in advance. In your last month of pregnancy, you may also come in for some individual visits. Additional appointments can be scheduled if you need more care. Studies have shown that CenteringPregnancy improves birth outcomes. We have seen especially big improvements in fewer Black women delivering babies who are too small or born too early. Visit the website CenteringHealthcare for more information. Let your provider or clinic staff know if you want to sign up.    

## 2022-06-04 ENCOUNTER — Encounter: Payer: Self-pay | Admitting: *Deleted

## 2022-06-09 ENCOUNTER — Telehealth (INDEPENDENT_AMBULATORY_CARE_PROVIDER_SITE_OTHER): Payer: Medicaid Other | Admitting: *Deleted

## 2022-06-09 DIAGNOSIS — Z8759 Personal history of other complications of pregnancy, childbirth and the puerperium: Secondary | ICD-10-CM

## 2022-06-09 DIAGNOSIS — O99210 Obesity complicating pregnancy, unspecified trimester: Secondary | ICD-10-CM

## 2022-06-09 DIAGNOSIS — O0990 Supervision of high risk pregnancy, unspecified, unspecified trimester: Secondary | ICD-10-CM

## 2022-06-09 DIAGNOSIS — Z3689 Encounter for other specified antenatal screening: Secondary | ICD-10-CM

## 2022-06-09 DIAGNOSIS — Z8619 Personal history of other infectious and parasitic diseases: Secondary | ICD-10-CM

## 2022-06-09 DIAGNOSIS — O09899 Supervision of other high risk pregnancies, unspecified trimester: Secondary | ICD-10-CM | POA: Insufficient documentation

## 2022-06-09 NOTE — Patient Instructions (Addendum)
[email or web address] is a website with information to help you prepare for Labor and delivery, patient information, visitor information and more.    Here is a link to the Pregnancy Navigators . Please Fill out prior to your New OB appointment.   English Link: [email or web address]  Spanish Link: [email or web address]   Go to [employer] to preview genetic testing

## 2022-06-09 NOTE — Progress Notes (Addendum)
New OB Intake  I connected with Julie Finley on 06/09/22 at  9:15 AM EST by MyChart Video Visit and verified that I am speaking with the correct person using two identifiers. Nurse is located at [facility] and pt is located at home.  I discussed the limitations, risks, security and privacy concerns of performing an evaluation and management service by telephone and the availability of in person appointments. I also discussed with the patient that there may be a patient responsible charge related to this service. The patient expressed understanding and agreed to proceed.  I explained I am completing New OB Intake today. We discussed Julie of 09/07/22 that is based on [place] due to irregular periods. Pt is G4/P3003. I reviewed her allergies, medications, Medical/Surgical/OB history, and appropriate screenings. I informed her of [facility] services. [facility] information placed in AVS. Based on history, this is a high risk pregnancy.  Patient Active Problem List   Diagnosis Date Noted   Short interval between pregnancies affecting pregnancy, antepartum 06/09/2022   Supervision of other high risk pregnancies, unspecified trimester 05/25/2022   Genital herpes 05/25/2022   History of pre-eclampsia 05/04/2022   Anxiety     Concerns addressed today  Delivery Plans Plans to deliver at [facility]. Patient given information for [facility] Healthy Baby website for more information about [facility]. Patient is interested in water birth. Offered upcoming OB visit with CNM to discuss further.  MyChart/Babyscripts MyChart access verified. I explained pt will have some visits in office and some virtually. Babyscripts instructions given and order placed.  Blood Pressure Cuff/Weight Scale Patient has blood pressure cuff from previous pregnancy.  Explained after first prenatal appt pt will check weekly and document in Babyscripts. Patient has weight scale.   Anatomy US Explained first scheduled [place] will be around 19  weeks. Anatomy US scheduled for 06/18/22 at 1245. Pt notified to arrive at 1230.  Labs Discussed Julie Finley genetic screening with patient. Is unsure about both Panorama and Horizon - well tell us at new ob.  Routine prenatal labs needed.  COVID Vaccine Patient has not had COVID vaccine.   Is patient a CenteringPregnancy candidate?  Not a Candidate due to no open groups for her Julie   Is patient a Mom+Baby Combined Care candidate?  Not a candidate    Social Determinants of Health Food Insecurity: Patient denies food insecurity. WIC Referral: Patient is not interested in referral to [facility].  Transportation: Patient denies transportation needs. Childcare: Discussed no children allowed at ultrasound appointments. Offered childcare services; patient declines childcare services at this time.  First visit review I reviewed new OB appt with patient. I explained they will have a provider visit that includes pregnancy exam, labs. Explained pt will be seen by Julie Finley  at first visit; encounter routed to appropriate provider. Explained that patient will be seen by pregnancy navigator following visit with provider.   Julie Finley 06/09/2022  9:56 AM   Patient was assessed and managed by nursing staff during this encounter. I have reviewed the chart and agree with the documentation and plan.   Julie Arbour, MSN, CNM, [facility] 06/09/22 12:31 PM

## 2022-06-18 ENCOUNTER — Ambulatory Visit: Payer: Medicaid Other | Admitting: *Deleted

## 2022-06-18 ENCOUNTER — Ambulatory Visit: Payer: Medicaid Other | Attending: Certified Nurse Midwife

## 2022-06-18 ENCOUNTER — Other Ambulatory Visit: Payer: Self-pay | Admitting: *Deleted

## 2022-06-18 VITALS — BP 125/65 | HR 73

## 2022-06-18 DIAGNOSIS — E669 Obesity, unspecified: Secondary | ICD-10-CM

## 2022-06-18 DIAGNOSIS — Z363 Encounter for antenatal screening for malformations: Secondary | ICD-10-CM | POA: Diagnosis not present

## 2022-06-18 DIAGNOSIS — O09293 Supervision of pregnancy with other poor reproductive or obstetric history, third trimester: Secondary | ICD-10-CM | POA: Diagnosis not present

## 2022-06-18 DIAGNOSIS — O99213 Obesity complicating pregnancy, third trimester: Secondary | ICD-10-CM

## 2022-06-18 DIAGNOSIS — Z8619 Personal history of other infectious and parasitic diseases: Secondary | ICD-10-CM | POA: Diagnosis present

## 2022-06-18 DIAGNOSIS — O0933 Supervision of pregnancy with insufficient antenatal care, third trimester: Secondary | ICD-10-CM

## 2022-06-18 DIAGNOSIS — Z8759 Personal history of other complications of pregnancy, childbirth and the puerperium: Secondary | ICD-10-CM | POA: Insufficient documentation

## 2022-06-18 DIAGNOSIS — O0990 Supervision of high risk pregnancy, unspecified, unspecified trimester: Secondary | ICD-10-CM | POA: Diagnosis present

## 2022-06-18 DIAGNOSIS — O99210 Obesity complicating pregnancy, unspecified trimester: Secondary | ICD-10-CM | POA: Diagnosis present

## 2022-06-18 DIAGNOSIS — Z3A28 28 weeks gestation of pregnancy: Secondary | ICD-10-CM

## 2022-06-18 DIAGNOSIS — O09893 Supervision of other high risk pregnancies, third trimester: Secondary | ICD-10-CM | POA: Diagnosis present

## 2022-06-22 NOTE — Progress Notes (Unsigned)
History:   Julie Finley is a 26 y.o. G4P3003 at [redacted]w[redacted]d by {Ob dating:14516} being seen today for her first obstetrical visit.  Her obstetrical history is significant for {ob risk factors:10154}. Patient {does/does not:19097} intend to breast feed. Pregnancy history fully reviewed.  Patient reports {sx:14538}.      HISTORY: OB History  Gravida Para Term Preterm AB Living  4 3 3  0 0 3  SAB IAB Ectopic Multiple Live Births  0 0 0 0 3    # Outcome Date GA Lbr Len/2nd Weight Sex Delivery Anes PTL Lv  4 Current           3 Term 05/30/21 [redacted]w[redacted]d / 00:16 6 lb 4.2 oz (2.841 kg) F Vag-Spont EPI  LIV     Birth Comments: GHTN, IOL for GHTN     Name: [facility]     Apgar1: 7  Apgar5: 9  2 Term 08/21/16 [redacted]w[redacted]d  7 lb 1 oz (3.204 kg) F Vag-Spont EPI  LIV     Birth Comments: wnl  1 Term 12/07/14 [redacted]w[redacted]d 13:17 / 01:01 6 lb 11.4 oz (3.045 kg) M Vag-Spont EPI  LIV     Birth Comments: Preeclampsia/ GHTN, fetal huypospadius, pyelonephritis     Apgar1: 9  Apgar5: 9    Last pap smear was done *** and was {Normal/Abnormal Appearance:21344::"normal"}  Past Medical History:  Diagnosis Date   Anxiety    GBS (group B Streptococcus carrier), +RV culture, currently pregnant 05/28/2021   Genital herpes    Gestational hypertension 05/04/2021   Medical history non-contributory    Post term pregnancy at [redacted] weeks gestation 08/21/2016   Pregnancy induced hypertension    had postpartum magnesium   Pyelonephritis 2016   Vaginal delivery 12/07/2014   Past Surgical History:  Procedure Laterality Date   NO PAST SURGERIES     Family History  Problem Relation Age of Onset   Hypertension Mother    Asthma Son    Diabetes Maternal Grandmother    Cancer Neg Hx    Heart disease Neg Hx    Social History   Tobacco Use   Smoking status: Former    Types: Cigars   Smokeless tobacco: Never  Vaping Use   Vaping Use: Never used  Substance Use Topics   Alcohol use: Not Currently    Comment: occasional    Drug use: No   No Known Allergies Current Outpatient Medications on File Prior to Visit  Medication Sig Dispense Refill   acetaminophen (TYLENOL) 500 MG tablet Take 2 tablets (1,000 mg total) by mouth every 8 (eight) hours as needed (pain). 60 tablet 0   aspirin EC 81 MG tablet Take 1 tablet (81 mg total) by mouth daily. Take after 12 weeks for prevention of preeclampssia later in pregnancy 300 tablet 2   NIFEdipine (ADALAT CC) 30 MG 24 hr tablet Take 1 tablet (30 mg total) by mouth daily. 30 tablet 0   Prenatal Vit-Fe Fumarate-FA (MULTIVITAMIN-PRENATAL) 27-0.8 MG TABS tablet Take 1 tablet by mouth daily at 12 noon.     No current facility-administered medications on file prior to visit.    Review of Systems Pertinent items noted in HPI and remainder of comprehensive ROS otherwise negative. Physical Exam:  There were no vitals filed for this visit.   ***Bedside Ultrasound for FHR check: Viable intrauterine pregnancy with positive cardiac activity note, fetal heart rate ***bpm Patient informed that the ultrasound is considered a limited obstetric ultrasound and is not intended to be  a complete ultrasound exam.  Patient also informed that the ultrasound is not being completed with the intent of assessing for fetal or placental anomalies or any pelvic abnormalities.  Explained that the purpose of today's ultrasound is to assess for fetal heart rate.  Patient acknowledges the purpose of the exam and the limitations of the study.  Constitutional: Well-developed, well-nourished pregnant female in no acute distress.  HEENT: PERRLA Skin: normal color and turgor, no rash Cardiovascular: normal rate & rhythm, no murmur Respiratory: normal effort, lung sounds clear throughout GI: Abd soft, non-tender, pos BS x 4, gravid appropriate for gestational age MS: Extremities nontender, no edema, normal ROM Neurologic: Alert and oriented x 4.  GU: no CVA tenderness Pelvic: NEFG, physiologic discharge, no  blood, cervix clean. ***Pap/swabs collected FHR:   Assessment:    Pregnancy: [id number] Patient Active Problem List   Diagnosis Date Noted   Short interval between pregnancies affecting pregnancy, antepartum 06/09/2022   Supervision of high-risk pregnancy 05/25/2022   Genital herpes 05/25/2022   History of pre-eclampsia 05/04/2022   Anxiety      Plan:    1. Supervision of high risk pregnancy in third trimester ***  2. [redacted] weeks gestation of pregnancy ***  3. History of pre-eclampsia ***  4. Late prenatal care ***  5. Initial obstetric visit in third trimester ***   Initial labs drawn. Continue prenatal vitamins. Problem list reviewed and updated. Genetic Screening discussed, {Blank multiple:19196::"First trimester screen","Quad screen","NIPS"}: {requests/ordered/declines:14581}. Ultrasound discussed; fetal anatomic survey: {requests/ordered/declines:14581}. Anticipatory guidance about prenatal visits given including labs, ultrasounds, and testing. Discussed usage of Babyscripts and virtual visits as additional source of managing and completing prenatal visits in midst of coronavirus and pandemic.   Encouraged to complete MyChart Registration for her ability to review results, send requests, and have questions addressed.  The nature of [place] [facility] with multiple MDs and Advanced Practice Providers was explained to patient; also emphasized that residents, students are part of our team. Routine obstetric precautions reviewed. Encouraged to seek out care at office or emergency room [facility] preferred) for urgent and/or emergent concerns. No follow-ups on file.     Gaylan Gerold, MSN, CNM, [place] Certified Nurse Midwife, [facility]

## 2022-06-23 ENCOUNTER — Ambulatory Visit: Payer: Self-pay | Admitting: Certified Nurse Midwife

## 2022-06-23 DIAGNOSIS — Z8759 Personal history of other complications of pregnancy, childbirth and the puerperium: Secondary | ICD-10-CM

## 2022-06-23 DIAGNOSIS — O0993 Supervision of high risk pregnancy, unspecified, third trimester: Secondary | ICD-10-CM

## 2022-06-23 DIAGNOSIS — O0933 Supervision of pregnancy with insufficient antenatal care, third trimester: Secondary | ICD-10-CM

## 2022-06-23 DIAGNOSIS — O0930 Supervision of pregnancy with insufficient antenatal care, unspecified trimester: Secondary | ICD-10-CM

## 2022-06-23 DIAGNOSIS — Z3A29 29 weeks gestation of pregnancy: Secondary | ICD-10-CM

## 2022-06-28 NOTE — L&D Delivery Note (Cosign Needed Addendum)
LABOR COURSE Pt admitted for IOL due to gHTN. 1cm dilated on initial exam. Induction with cytotec, foley balloon.   Delivery Note Called to room and patient was complete and pushing. Head delivered within intact membranes ROA. Loose nuchal cord present. Membranes ruptured with delivery of anterior shoulder. Shoulder and body delivered in usual fashion. At 77 a viable female infant was delivered via Vaginal, Spontaneous (Presentation: ROA).  Infant with spontaneous cry, placed on mother's abdomen, dried and stimulated. Cord clamped x 2 after 1-minute delay, and cut by FOB. Cord blood drawn. Placenta delivered spontaneously with gentle cord traction. Appears intact. Fundus firm with massage and Pitocin. Labia, perineum, vagina, and cervix inspected with abrasion of perineum.    APGAR: 8,9 ; weight pending.   Cord: 3VC with the following complications: none     Anesthesia:  epidural Episiotomy: None Lacerations: none Suture Repair:  none Est. Blood Loss (mL): 336cc  Mom to postpartum.  Baby to Couplet care / Skin to Skin.  Darlen Round, MD PGY-1 08/27/2022 2:40 PM      Fellow ATTESTATION  I was present and gloved for this delivery and agree with the above documentation in the resident's note.   Chiagoziem Sherrilyn Rist, MD/[facility] [employer] (Faculty Practice) 08/28/2022, 7:25 AM

## 2022-07-14 ENCOUNTER — Ambulatory Visit: Payer: MEDICAID

## 2022-07-26 ENCOUNTER — Ambulatory Visit: Payer: Medicaid Other | Attending: Maternal & Fetal Medicine

## 2022-07-26 ENCOUNTER — Ambulatory Visit: Payer: Medicaid Other | Admitting: *Deleted

## 2022-07-26 ENCOUNTER — Other Ambulatory Visit: Payer: Self-pay | Admitting: Maternal & Fetal Medicine

## 2022-07-26 VITALS — BP 126/74 | HR 66

## 2022-07-26 DIAGNOSIS — Z362 Encounter for other antenatal screening follow-up: Secondary | ICD-10-CM | POA: Diagnosis not present

## 2022-07-26 DIAGNOSIS — O0933 Supervision of pregnancy with insufficient antenatal care, third trimester: Secondary | ICD-10-CM | POA: Diagnosis not present

## 2022-07-26 DIAGNOSIS — Z3A33 33 weeks gestation of pregnancy: Secondary | ICD-10-CM

## 2022-07-26 DIAGNOSIS — O0993 Supervision of high risk pregnancy, unspecified, third trimester: Secondary | ICD-10-CM | POA: Insufficient documentation

## 2022-07-26 DIAGNOSIS — E669 Obesity, unspecified: Secondary | ICD-10-CM

## 2022-07-26 DIAGNOSIS — O99213 Obesity complicating pregnancy, third trimester: Secondary | ICD-10-CM | POA: Diagnosis not present

## 2022-07-26 DIAGNOSIS — O09893 Supervision of other high risk pregnancies, third trimester: Secondary | ICD-10-CM

## 2022-07-30 ENCOUNTER — Ambulatory Visit: Payer: Medicaid Other | Attending: Maternal & Fetal Medicine

## 2022-07-30 ENCOUNTER — Ambulatory Visit: Payer: Medicaid Other | Admitting: *Deleted

## 2022-07-30 VITALS — BP 136/67 | HR 65

## 2022-07-30 DIAGNOSIS — E669 Obesity, unspecified: Secondary | ICD-10-CM

## 2022-07-30 DIAGNOSIS — O0933 Supervision of pregnancy with insufficient antenatal care, third trimester: Secondary | ICD-10-CM | POA: Diagnosis not present

## 2022-07-30 DIAGNOSIS — Z3A34 34 weeks gestation of pregnancy: Secondary | ICD-10-CM

## 2022-07-30 DIAGNOSIS — O0993 Supervision of high risk pregnancy, unspecified, third trimester: Secondary | ICD-10-CM | POA: Diagnosis present

## 2022-07-30 DIAGNOSIS — O09293 Supervision of pregnancy with other poor reproductive or obstetric history, third trimester: Secondary | ICD-10-CM

## 2022-07-30 DIAGNOSIS — O99213 Obesity complicating pregnancy, third trimester: Secondary | ICD-10-CM | POA: Insufficient documentation

## 2022-07-30 DIAGNOSIS — O10013 Pre-existing essential hypertension complicating pregnancy, third trimester: Secondary | ICD-10-CM

## 2022-08-06 ENCOUNTER — Ambulatory Visit: Payer: MEDICAID

## 2022-08-09 ENCOUNTER — Encounter: Payer: Self-pay | Admitting: Obstetrics and Gynecology

## 2022-08-09 ENCOUNTER — Ambulatory Visit (INDEPENDENT_AMBULATORY_CARE_PROVIDER_SITE_OTHER): Payer: Medicaid Other | Admitting: Obstetrics and Gynecology

## 2022-08-09 ENCOUNTER — Other Ambulatory Visit (HOSPITAL_COMMUNITY)
Admission: RE | Admit: 2022-08-09 | Discharge: 2022-08-09 | Disposition: A | Discharge status: Home / Self Care | Payer: Medicaid Other | Source: Ambulatory Visit | Attending: Certified Nurse Midwife | Admitting: Certified Nurse Midwife

## 2022-08-09 ENCOUNTER — Other Ambulatory Visit: Payer: Self-pay

## 2022-08-09 VITALS — BP 137/82 | HR 68 | Wt 271.5 lb

## 2022-08-09 DIAGNOSIS — Z6841 Body Mass Index (BMI) 40.0 and over, adult: Secondary | ICD-10-CM

## 2022-08-09 DIAGNOSIS — O0933 Supervision of pregnancy with insufficient antenatal care, third trimester: Secondary | ICD-10-CM | POA: Diagnosis not present

## 2022-08-09 DIAGNOSIS — Z8759 Personal history of other complications of pregnancy, childbirth and the puerperium: Secondary | ICD-10-CM

## 2022-08-09 DIAGNOSIS — O09899 Supervision of other high risk pregnancies, unspecified trimester: Secondary | ICD-10-CM

## 2022-08-09 DIAGNOSIS — O0993 Supervision of high risk pregnancy, unspecified, third trimester: Secondary | ICD-10-CM

## 2022-08-09 DIAGNOSIS — Z3A35 35 weeks gestation of pregnancy: Secondary | ICD-10-CM

## 2022-08-09 DIAGNOSIS — O99213 Obesity complicating pregnancy, third trimester: Secondary | ICD-10-CM

## 2022-08-09 DIAGNOSIS — A6004 Herpesviral vulvovaginitis: Secondary | ICD-10-CM

## 2022-08-09 DIAGNOSIS — O09893 Supervision of other high risk pregnancies, third trimester: Secondary | ICD-10-CM

## 2022-08-09 DIAGNOSIS — O163 Unspecified maternal hypertension, third trimester: Secondary | ICD-10-CM | POA: Diagnosis not present

## 2022-08-09 MED ORDER — VALACYCLOVIR HCL 500 MG PO TABS: 500.0000 mg | ORAL_TABLET | Freq: Two times a day (BID) | ORAL | 0 refills | Status: DC

## 2022-08-10 LAB — CBC/D/PLT+RPR+RH+ABO+RUBIGG...
Antibody Screen: NEGATIVE
Basophils Absolute: 0 10*3/uL (ref 0.0–0.2)
Basos: 0 %
EOS (ABSOLUTE): 0.1 10*3/uL (ref 0.0–0.4)
Eos: 2 %
HCV Ab: NONREACTIVE
HIV Screen 4th Generation wRfx: NONREACTIVE
Hematocrit: 36.8 % (ref 34.0–46.6)
Hemoglobin: 11.9 g/dL (ref 11.1–15.9)
Hepatitis B Surface Ag: NEGATIVE
Immature Grans (Abs): 0 10*3/uL (ref 0.0–0.1)
Immature Granulocytes: 0 %
Lymphocytes Absolute: 2.4 10*3/uL (ref 0.7–3.1)
Lymphs: 30 %
MCH: 25.3 pg — ABNORMAL LOW (ref 26.6–33.0)
MCHC: 32.3 g/dL (ref 31.5–35.7)
MCV: 78 fL — ABNORMAL LOW (ref 79–97)
Monocytes Absolute: 0.7 10*3/uL (ref 0.1–0.9)
Monocytes: 9 %
Neutrophils Absolute: 4.7 10*3/uL (ref 1.4–7.0)
Neutrophils: 59 %
Platelets: 340 10*3/uL (ref 150–450)
RBC: 4.7 x10E6/uL (ref 3.77–5.28)
RDW: 14.4 % (ref 11.7–15.4)
RPR Ser Ql: NONREACTIVE
Rh Factor: POSITIVE
Rubella Antibodies, IGG: <0.9 index — ABNORMAL LOW (ref >0.99)
WBC: 8 10*3/uL (ref 3.4–10.8)

## 2022-08-10 LAB — COMPREHENSIVE METABOLIC PANEL WITH GFR
ALT: 13 IU/L (ref 0–32)
AST: 12 IU/L (ref 0–40)
Albumin/Globulin Ratio: 1.2 (ref 1.2–2.2)
Albumin: 3.2 g/dL — ABNORMAL LOW (ref 4.0–5.0)
Alkaline Phosphatase: 144 IU/L — ABNORMAL HIGH (ref 44–121)
BUN/Creatinine Ratio: 12 (ref 9–23)
BUN: 7 mg/dL (ref 6–20)
Bilirubin Total: <0.2 mg/dL (ref 0.0–1.2)
CO2: 17 mmol/L — ABNORMAL LOW (ref 20–29)
Calcium: 8.7 mg/dL (ref 8.7–10.2)
Chloride: 108 mmol/L — ABNORMAL HIGH (ref 96–106)
Creatinine, Ser: 0.58 mg/dL (ref 0.57–1.00)
Globulin, Total: 2.7 g/dL (ref 1.5–4.5)
Glucose: 81 mg/dL (ref 70–99)
Potassium: 4.2 mmol/L (ref 3.5–5.2)
Sodium: 138 mmol/L (ref 134–144)
Total Protein: 5.9 g/dL — ABNORMAL LOW (ref 6.0–8.5)
eGFR: 126 mL/min/1.73 (ref >59)

## 2022-08-10 LAB — HEMOGLOBIN A1C
Est. average glucose Bld gHb Est-mCnc: 117 mg/dL
Hgb A1c MFr Bld: 5.7 % — ABNORMAL HIGH (ref 4.8–5.6)

## 2022-08-10 LAB — TSH RFX ON ABNORMAL TO FREE T4: TSH: 1.02 u[IU]/mL (ref 0.450–4.500)

## 2022-08-10 LAB — HCV INTERPRETATION

## 2022-08-10 NOTE — Progress Notes (Signed)
PRENATAL VISIT NOTE  Subjective:  Julie Finley is a 27 y.o. G4P3003 at 37w6dbeing seen today for ongoing prenatal care.  She is currently monitored for the following issues for this high-risk pregnancy and has Anxiety; History of gestational hypertension; Supervision of high-risk pregnancy; Genital herpes; Short interval between pregnancies affecting pregnancy, antepartum; BMI 40.0-44.9, adult ([place]; and Obesity in pregnancy, antepartum, third trimester on their problem list.  Patient reports no complaints.  Contractions: Irritability. Vag. Bleeding: None.  Movement: Present. Denies leaking of fluid.   The following portions of the patient's history were reviewed and updated as appropriate: allergies, current medications, past family history, past medical history, past social history, past surgical history and problem list.   Objective:   Vitals:   08/09/22 1531  BP: 137/82  Pulse: 68  Weight: 271 lb 8 oz (123.2 kg)    Fetal Status: Fetal Heart Rate (bpm): 130 Fundal Height: 35 cm Movement: Present  Presentation: Vertex  General:  Alert, oriented and cooperative. Patient is in no acute distress.  Skin: Skin is warm and dry. No rash noted.   Cardiovascular: Normal heart rate noted  Respiratory: Normal respiratory effort, no problems with respiration noted  Abdomen: Soft, gravid, appropriate for gestational age.  Pain/Pressure: Absent     Pelvic: Cervical exam performed in the presence of a chaperone Dilation: 1 Effacement (%): 0    Extremities: Normal range of motion.  Edema: None  Mental Status: Normal mood and affect. Normal behavior. Normal judgment and thought content.   Assessment and Plan:  Pregnancy: G4P3003 at 383w6d. Supervision of high risk pregnancy in third trimester D/w her more re: birth control next visit - CBC/D/Plt+RPR+Rh+ABO+RubIgG... - HgB A1c - Culture, beta strep (group b only) - Cervicovaginal ancillary only( [place]) - Culture, OB Urine - TSH  Rfx on Abnormal to Free T4 - Comprehensive metabolic panel - CBC - Protein / creatinine ratio, urine  2. No prenatal care in current pregnancy in third trimester Patient going to MFM ultrasound visits. SW consult postpartum  3. History of gestational [facility]  4. Hypertension in pregnancy, third trimester Patient had a 152/65 (126/74 on repeat) at 33/6 and a 140/70 (136/67 on repeat) at 34/3 MFM saying she has CHTN. I told her I recommend delivery at 37wks, but she would like to hold off on this. Continue with weekly testing, next on on 2/16 2/2 bpp: 8/8, afi 17, cephalic 1/[id number] 41%, bpp 8/8  5. Obesity in pregnancy, antepartum, third trimester  6. BMI 40.0-44.9, adult ([place] 7. Herpes simplex vulvovaginitis Pt okay with starting valtrex ppx  8. Short interval between pregnancies affecting pregnancy, antepartum See above  Preterm labor symptoms and general obstetric precautions including but not limited to vaginal bleeding, contractions, leaking of fluid and fetal movement were reviewed in detail with the patient. Please refer to After Visit Summary for other counseling recommendations.   Return in 1 week (on 08/16/2022) for fasting 2hr GTT, in person, md visit, high risk ob.  Future Appointments  Date Time Provider [place]2/16/2024  3:30 PM [facility]URSE WMS[facility]2/16/2024  3:45 PM WMC-MFC US4 WMC-MFCUS [facility]2/23/2024  8:20 AM WMC-WOCA LAB WMC-CWH [facility]2/23/2024  8:55 AM NeCaren MacadamMD [facility]2/23/2024  3:30 PM WMC-MFC NURSE WMC-MFC [facility]2/23/2024  3:45 PM WMC-MFC US4 WMC-MFCUS WM[facility]3/06/2022  3:30 PM WMC-MFC NURSE WMC-MFC  [facility]  08/27/2022  3:45 PM WMC-MFC US4 WMC-MFCUS [facility]  08/30/2022  8:55 AM Griffin Basil, MD [facility]  09/06/2022 10:15 AM Aletha Halim, MD [facility]  09/13/2022 10:15 AM WMC-WOCA NST [facility]  09/13/2022 11:15 AM Radene Gunning, MD [facility]     Aletha Halim, MD

## 2022-08-11 LAB — PROTEIN / CREATININE RATIO, URINE
Creatinine, Urine: 137.4 mg/dL
Protein, Ur: 17.5 mg/dL
Protein/Creat Ratio: 127 mg/g creat (ref 0–200)

## 2022-08-11 LAB — URINE CULTURE, OB REFLEX

## 2022-08-11 LAB — CULTURE, OB URINE

## 2022-08-12 ENCOUNTER — Encounter: Payer: Self-pay | Admitting: Obstetrics and Gynecology

## 2022-08-12 ENCOUNTER — Telehealth: Payer: Self-pay | Admitting: Family Medicine

## 2022-08-12 ENCOUNTER — Encounter: Payer: Self-pay | Admitting: *Deleted

## 2022-08-12 DIAGNOSIS — O169 Unspecified maternal hypertension, unspecified trimester: Secondary | ICD-10-CM | POA: Insufficient documentation

## 2022-08-12 DIAGNOSIS — O165 Unspecified maternal hypertension, complicating the puerperium: Secondary | ICD-10-CM | POA: Insufficient documentation

## 2022-08-12 NOTE — Telephone Encounter (Signed)
Called patient to inform of 2 hour glucose test. There was no answer to the phone call so a voicemail was left and a mychart message was sent.

## 2022-08-13 ENCOUNTER — Ambulatory Visit: Payer: Medicaid Other | Attending: Maternal & Fetal Medicine

## 2022-08-13 ENCOUNTER — Ambulatory Visit: Payer: Medicaid Other | Admitting: *Deleted

## 2022-08-13 VITALS — BP 133/71 | HR 65

## 2022-08-13 DIAGNOSIS — O0993 Supervision of high risk pregnancy, unspecified, third trimester: Secondary | ICD-10-CM | POA: Insufficient documentation

## 2022-08-13 DIAGNOSIS — E669 Obesity, unspecified: Secondary | ICD-10-CM

## 2022-08-13 DIAGNOSIS — O10013 Pre-existing essential hypertension complicating pregnancy, third trimester: Secondary | ICD-10-CM

## 2022-08-13 DIAGNOSIS — O99213 Obesity complicating pregnancy, third trimester: Secondary | ICD-10-CM | POA: Diagnosis present

## 2022-08-13 DIAGNOSIS — O0933 Supervision of pregnancy with insufficient antenatal care, third trimester: Secondary | ICD-10-CM | POA: Diagnosis not present

## 2022-08-13 DIAGNOSIS — Z3A36 36 weeks gestation of pregnancy: Secondary | ICD-10-CM

## 2022-08-13 DIAGNOSIS — O09293 Supervision of pregnancy with other poor reproductive or obstetric history, third trimester: Secondary | ICD-10-CM

## 2022-08-13 LAB — CULTURE, BETA STREP (GROUP B ONLY): Strep Gp B Culture: NEGATIVE

## 2022-08-16 LAB — CERVICOVAGINAL ANCILLARY ONLY
Bacterial Vaginitis (gardnerella): NEGATIVE
Candida Glabrata: NEGATIVE
Candida Vaginitis: NEGATIVE
Chlamydia: NEGATIVE
Comment: NEGATIVE
Comment: NEGATIVE
Comment: NEGATIVE
Comment: NEGATIVE
Comment: NEGATIVE
Comment: NORMAL
Neisseria Gonorrhea: NEGATIVE
Trichomonas: NEGATIVE

## 2022-08-19 ENCOUNTER — Other Ambulatory Visit: Payer: Self-pay | Admitting: *Deleted

## 2022-08-19 DIAGNOSIS — O0990 Supervision of high risk pregnancy, unspecified, unspecified trimester: Secondary | ICD-10-CM

## 2022-08-20 ENCOUNTER — Ambulatory Visit: Payer: Medicaid Other | Admitting: Family Medicine

## 2022-08-20 ENCOUNTER — Ambulatory Visit: Payer: Medicaid Other

## 2022-08-20 ENCOUNTER — Ambulatory Visit: Payer: Medicaid Other | Attending: Maternal & Fetal Medicine

## 2022-08-20 ENCOUNTER — Other Ambulatory Visit: Payer: Medicaid Other

## 2022-08-20 ENCOUNTER — Encounter: Payer: Self-pay | Admitting: Family Medicine

## 2022-08-20 ENCOUNTER — Inpatient Hospital Stay (HOSPITAL_COMMUNITY): Admit: 2022-08-20 | Payer: MEDICAID | Source: Home / Self Care | Admitting: Obstetrics & Gynecology

## 2022-08-20 ENCOUNTER — Other Ambulatory Visit: Payer: Self-pay

## 2022-08-20 ENCOUNTER — Other Ambulatory Visit: Payer: Self-pay | Admitting: Obstetrics & Gynecology

## 2022-08-20 VITALS — BP 146/88 | HR 71 | Wt 273.4 lb

## 2022-08-20 DIAGNOSIS — Z2839 Other underimmunization status: Secondary | ICD-10-CM

## 2022-08-20 DIAGNOSIS — Z0289 Encounter for other administrative examinations: Secondary | ICD-10-CM

## 2022-08-20 DIAGNOSIS — O09893 Supervision of other high risk pregnancies, third trimester: Secondary | ICD-10-CM | POA: Diagnosis not present

## 2022-08-20 DIAGNOSIS — O133 Gestational [pregnancy-induced] hypertension without significant proteinuria, third trimester: Secondary | ICD-10-CM

## 2022-08-20 DIAGNOSIS — O0993 Supervision of high risk pregnancy, unspecified, third trimester: Secondary | ICD-10-CM

## 2022-08-20 DIAGNOSIS — O09899 Supervision of other high risk pregnancies, unspecified trimester: Secondary | ICD-10-CM

## 2022-08-20 DIAGNOSIS — O99213 Obesity complicating pregnancy, third trimester: Secondary | ICD-10-CM

## 2022-08-20 DIAGNOSIS — Z8759 Personal history of other complications of pregnancy, childbirth and the puerperium: Secondary | ICD-10-CM

## 2022-08-20 DIAGNOSIS — Z3A37 37 weeks gestation of pregnancy: Secondary | ICD-10-CM

## 2022-08-20 DIAGNOSIS — O0933 Supervision of pregnancy with insufficient antenatal care, third trimester: Secondary | ICD-10-CM | POA: Insufficient documentation

## 2022-08-20 DIAGNOSIS — O0990 Supervision of high risk pregnancy, unspecified, unspecified trimester: Secondary | ICD-10-CM

## 2022-08-20 DIAGNOSIS — O163 Unspecified maternal hypertension, third trimester: Secondary | ICD-10-CM

## 2022-08-20 NOTE — Progress Notes (Signed)
Patient called at home and advised to come in to hospital today for IOL given persistently elevated BPs, currently at [redacted]w[redacted]d  She agreed with this plan, will come in after settling childcare plans.  UVerita Schneiders MD, FSaticoyfor WDe[employer] Company CBevier

## 2022-08-20 NOTE — Progress Notes (Signed)
PRENATAL VISIT NOTE  Subjective:  Julie Finley is a 27 y.o. G4P3003 at 81w3dbeing seen today for ongoing prenatal care.  She is currently monitored for the following issues for this high-risk pregnancy and has Anxiety; Rubella non-immune status, antepartum; History of gestational hypertension; Supervision of high-risk pregnancy; Genital herpes; Short interval between pregnancies affecting pregnancy, antepartum; BMI 40.0-44.9, adult ([place]; Obesity in pregnancy, antepartum, third trimester; CHTN vs GHTN (Feb 2024); and Late prenatal care affecting pregnancy in third trimester on their problem list.  Patient reports no complaints.  Contractions: Not present. Vag. Bleeding: None.  Movement: Present. Denies leaking of fluid.   The following portions of the patient's history were reviewed and updated as appropriate: allergies, current medications, past family history, past medical history, past social history, past surgical history and problem list.   Objective:   Vitals:   08/20/22 0933 08/20/22 0936 08/20/22 1007  BP: (!) 163/96 (!) 152/90 (!) 146/88  Pulse:  71   Weight: 273 lb 6.4 oz (124 kg)      Fetal Status: Fetal Heart Rate (bpm): 141   Movement: Present     General:  Alert, oriented and cooperative. Patient is in no acute distress.  Skin: Skin is warm and dry. No rash noted.   Cardiovascular: Normal heart rate noted  Respiratory: Normal respiratory effort, no problems with respiration noted  Abdomen: Soft, gravid, appropriate for gestational age.  Pain/Pressure: Absent     Pelvic: Cervical exam deferred        Extremities: Normal range of motion.     Mental Status: Normal mood and affect. Normal behavior. Normal judgment and thought content.   Assessment and Plan:  Pregnancy: G4P3003 at 33w3d1. Supervision of high risk pregnancy in third trimester Up to date Good fetal movement today Completing GTT today It looks like patient was interested in WB and I discussed  clearly today she not eligible due to cHTN   2. Hypertension during pregnancy in third trimester, unspecified hypertension in pregnancy type Blood pressures are elevated today to mild range and her initial prenatal was 137/82 In November 2023 at UPT visit was 133/73 '@25'$  weeks and at her postpartum in Jan 2023 she was normotensive at 129/80. UPC=127, CMP WNL CBC WNL at new OB HIGHLY suspicious for GHTN Discussed that I recommend assessment at MAU due to likely gHTN Reviewed need for extended BP monitoring and likely need for induction Patient has a 1 46r old with her and plan to arrange childcare and go to MAU  3. Obesity in pregnancy, antepartum, third trimester TWG=12 lb 6.4 oz (5.625 kg)   4. Rubella non-immune status, antepartum MMR PP  5. Short interval between pregnancies affecting pregnancy, antepartum Has 1 yr  6. History of gestational hypertension BP is elevated today   Preterm labor symptoms and general obstetric precautions including but not limited to vaginal bleeding, contractions, leaking of fluid and fetal movement were reviewed in detail with the patient. Please refer to After Visit Summary for other counseling recommendations.   Return in about 1 week (around 08/27/2022) for Routine prenatal care.  Future Appointments  Date Time Provider De[place]2/23/2024  3:30 PM WME[facility]URSE WMBed[facility] HospitalME[facility]2/23/2024  3:45 PM WMC-MFC US4 WMC-MFCUS [facility]3/06/2022  3:30 PM WMC-MFC NURSE WMC-MFC [facility]3/06/2022  3:45 PM WMC-MFC US4 WMC-MFCUS [facility]3/09/2022  8:55 AM BaGriffin BasilMD WMNew Hanover [facility] CenterMS[facility]3/04/2023 10:15 AM PiAletha HalimMD WMSanta [facility]3/18/2024 10:15 AM  [facility] NST [facility]  09/13/2022 11:15 AM Radene Gunning, MD [facility]    Caren Macadam, MD

## 2022-08-21 ENCOUNTER — Telehealth: Payer: Self-pay | Admitting: Advanced Practice Midwife

## 2022-08-21 LAB — GLUCOSE TOLERANCE, 2 HOURS W/ 1HR
Glucose, 1 hour: 116 mg/dL (ref 70–179)
Glucose, 2 hour: 72 mg/dL (ref 70–152)
Glucose, Fasting: 73 mg/dL (ref 70–91)

## 2022-08-21 NOTE — Telephone Encounter (Signed)
Patient returned my voicemail. She states her parents had to go out of town unexpectedly and she was unable to attend her scheduled IOL due to lack of child care. She states they are returned to town Tuesday night and she can present to [facility] for IOL Wednesday morning.   Patient states she has been checking her BP on her home cuff and it is WNL. She also states she "feels fine" and knows which symptoms would necessitate evaluation in MAU.  Discussed with Karie Schwalbe, L&D Charge RN who states patient can be added on to IOL schedule for Wed morning 08/25/2022.  Mallie Snooks, [place], MSN, CNM 08/21/22 7:51 PM

## 2022-08-21 NOTE — Telephone Encounter (Signed)
Attempted to reach patient to discuss missed appointment for induction of labor. Phone rang, I left a voicemail that a provider is on 24/7 and can discuss updated appointment for her to come for induction.   Discussed with Susanne Greenhouse, L&D Agricultural consultant. Patient may come for induction of labor this evening, pending phone call with patient, if unit census does not change.  Mallie Snooks, [place] 08/21/22 6:40 PM

## 2022-08-23 ENCOUNTER — Telehealth (HOSPITAL_COMMUNITY): Payer: Self-pay | Admitting: *Deleted

## 2022-08-23 ENCOUNTER — Encounter (HOSPITAL_COMMUNITY): Payer: Self-pay | Admitting: *Deleted

## 2022-08-23 NOTE — Telephone Encounter (Signed)
Preadmission screen  

## 2022-08-25 ENCOUNTER — Inpatient Hospital Stay (HOSPITAL_COMMUNITY): Payer: Medicaid Other

## 2022-08-27 ENCOUNTER — Ambulatory Visit: Payer: MEDICAID

## 2022-08-27 ENCOUNTER — Other Ambulatory Visit: Payer: Self-pay

## 2022-08-27 ENCOUNTER — Encounter (HOSPITAL_COMMUNITY): Payer: Self-pay | Admitting: Obstetrics & Gynecology

## 2022-08-27 ENCOUNTER — Inpatient Hospital Stay (HOSPITAL_COMMUNITY)
Admission: RE | Admit: 2022-08-27 | Discharge: 2022-08-28 | DRG: 806 | Disposition: A | Discharge status: Home / Self Care | Payer: Medicaid Other | Attending: Obstetrics and Gynecology | Admitting: Obstetrics and Gynecology

## 2022-08-27 ENCOUNTER — Inpatient Hospital Stay (HOSPITAL_COMMUNITY): Payer: Medicaid Other | Admitting: Anesthesiology

## 2022-08-27 DIAGNOSIS — Z23 Encounter for immunization: Secondary | ICD-10-CM

## 2022-08-27 DIAGNOSIS — O134 Gestational [pregnancy-induced] hypertension without significant proteinuria, complicating childbirth: Principal | ICD-10-CM | POA: Diagnosis present

## 2022-08-27 DIAGNOSIS — Z87891 Personal history of nicotine dependence: Secondary | ICD-10-CM

## 2022-08-27 DIAGNOSIS — O99824 Streptococcus B carrier state complicating childbirth: Secondary | ICD-10-CM | POA: Diagnosis present

## 2022-08-27 DIAGNOSIS — O0990 Supervision of high risk pregnancy, unspecified, unspecified trimester: Secondary | ICD-10-CM

## 2022-08-27 DIAGNOSIS — O99214 Obesity complicating childbirth: Secondary | ICD-10-CM | POA: Diagnosis present

## 2022-08-27 DIAGNOSIS — F419 Anxiety disorder, unspecified: Secondary | ICD-10-CM | POA: Diagnosis present

## 2022-08-27 DIAGNOSIS — O9832 Other infections with a predominantly sexual mode of transmission complicating childbirth: Secondary | ICD-10-CM

## 2022-08-27 DIAGNOSIS — O99344 Other mental disorders complicating childbirth: Secondary | ICD-10-CM

## 2022-08-27 DIAGNOSIS — Z3A38 38 weeks gestation of pregnancy: Secondary | ICD-10-CM | POA: Diagnosis not present

## 2022-08-27 DIAGNOSIS — O09899 Supervision of other high risk pregnancies, unspecified trimester: Secondary | ICD-10-CM

## 2022-08-27 DIAGNOSIS — O133 Gestational [pregnancy-induced] hypertension without significant proteinuria, third trimester: Secondary | ICD-10-CM | POA: Diagnosis present

## 2022-08-27 DIAGNOSIS — A6000 Herpesviral infection of urogenital system, unspecified: Secondary | ICD-10-CM | POA: Diagnosis present

## 2022-08-27 DIAGNOSIS — O165 Unspecified maternal hypertension, complicating the puerperium: Secondary | ICD-10-CM | POA: Diagnosis present

## 2022-08-27 DIAGNOSIS — Z8759 Personal history of other complications of pregnancy, childbirth and the puerperium: Secondary | ICD-10-CM

## 2022-08-27 DIAGNOSIS — Z2839 Encounter for supervision of normal pregnancy, unspecified, unspecified trimester: Secondary | ICD-10-CM

## 2022-08-27 HISTORY — DX: Gestational (pregnancy-induced) hypertension without significant proteinuria, third trimester: O13.3

## 2022-08-27 LAB — COMPREHENSIVE METABOLIC PANEL
ALT: 15 U/L (ref 0–44)
AST: 21 U/L (ref 15–41)
Albumin: 2.3 g/dL — ABNORMAL LOW (ref 3.5–5.0)
Alkaline Phosphatase: 118 U/L (ref 38–126)
Anion gap: 9 (ref 5–15)
BUN: 7 mg/dL (ref 6–20)
CO2: 18 mmol/L — ABNORMAL LOW (ref 22–32)
Calcium: 8.4 mg/dL — ABNORMAL LOW (ref 8.9–10.3)
Chloride: 108 mmol/L (ref 98–111)
Creatinine, Ser: 0.7 mg/dL (ref 0.44–1.00)
GFR, Estimated: >60 mL/min (ref >60)
Glucose, Bld: 106 mg/dL — ABNORMAL HIGH (ref 70–99)
Potassium: 3.4 mmol/L — ABNORMAL LOW (ref 3.5–5.1)
Sodium: 135 mmol/L (ref 135–145)
Total Bilirubin: 0.3 mg/dL (ref 0.3–1.2)
Total Protein: 5.8 g/dL — ABNORMAL LOW (ref 6.5–8.1)

## 2022-08-27 LAB — PROTEIN / CREATININE RATIO, URINE
Creatinine, Urine: 43 mg/dL
Total Protein, Urine: <6 mg/dL

## 2022-08-27 LAB — CBC
HCT: 37.9 % (ref 36.0–46.0)
Hemoglobin: 12 g/dL (ref 12.0–15.0)
MCH: 24.9 pg — ABNORMAL LOW (ref 26.0–34.0)
MCHC: 31.7 g/dL (ref 30.0–36.0)
MCV: 78.8 fL — ABNORMAL LOW (ref 80.0–100.0)
Platelets: 300 10*3/uL (ref 150–400)
RBC: 4.81 MIL/uL (ref 3.87–5.11)
RDW: 15.6 % — ABNORMAL HIGH (ref 11.5–15.5)
WBC: 8.6 10*3/uL (ref 4.0–10.5)
nRBC: 0 % (ref 0.0–0.2)

## 2022-08-27 LAB — TYPE AND SCREEN
ABO/RH(D): O POS
Antibody Screen: NEGATIVE

## 2022-08-27 LAB — RPR: RPR Ser Ql: NONREACTIVE

## 2022-08-27 MED ORDER — SODIUM CHLORIDE 0.9 % IV SOLN: 250.0000 mL | INTRAVENOUS | Status: DC | PRN

## 2022-08-27 MED ORDER — FUROSEMIDE 20 MG PO TABS
20.0000 mg | ORAL_TABLET | Freq: Every day | ORAL | Status: DC
  Administered 2022-08-27 – 2022-08-28 (×2): 20 mg via ORAL
  Filled 2022-08-27 (×2): qty 1

## 2022-08-27 MED ORDER — SOD CITRATE-CITRIC ACID 500-334 MG/5ML PO SOLN: 30.0000 mL | ORAL | Status: DC | PRN

## 2022-08-27 MED ORDER — LIDOCAINE HCL (PF) 1 % IJ SOLN
INTRAMUSCULAR | Status: DC | PRN
  Administered 2022-08-27: 2 mL via EPIDURAL
  Administered 2022-08-27: 10 mL via EPIDURAL

## 2022-08-27 MED ORDER — MISOPROSTOL 25 MCG QUARTER TABLET
25.0000 ug | ORAL_TABLET | Freq: Once | ORAL | Status: AC
  Administered 2022-08-27: 25 ug via VAGINAL
  Filled 2022-08-27: qty 1

## 2022-08-27 MED ORDER — SODIUM CHLORIDE 0.9% FLUSH: 3.0000 mL | INTRAVENOUS | Status: DC | PRN

## 2022-08-27 MED ORDER — LACTATED RINGERS IV SOLN
500.0000 mL | INTRAVENOUS | Status: DC | PRN
  Administered 2022-08-27: 500 mL via INTRAVENOUS

## 2022-08-27 MED ORDER — DIPHENHYDRAMINE HCL 50 MG/ML IJ SOLN: 12.5000 mg | INTRAMUSCULAR | Status: DC | PRN

## 2022-08-27 MED ORDER — HYDRALAZINE HCL 20 MG/ML IJ SOLN: 10.0000 mg | INTRAMUSCULAR | Status: DC | PRN

## 2022-08-27 MED ORDER — FENTANYL CITRATE (PF) 100 MCG/2ML IJ SOLN: 50.0000 ug | INTRAMUSCULAR | Status: DC | PRN

## 2022-08-27 MED ORDER — WITCH HAZEL-GLYCERIN EX PADS: 1.0000 | MEDICATED_PAD | CUTANEOUS | Status: DC | PRN

## 2022-08-27 MED ORDER — LACTATED RINGERS IV SOLN
500.0000 mL | Freq: Once | INTRAVENOUS | Status: AC
  Administered 2022-08-27: 500 mL via INTRAVENOUS

## 2022-08-27 MED ORDER — ACETAMINOPHEN 325 MG PO TABS: 650.0000 mg | ORAL_TABLET | ORAL | Status: DC | PRN

## 2022-08-27 MED ORDER — OXYTOCIN BOLUS FROM INFUSION: 333.0000 mL | Freq: Once | INTRAVENOUS | Status: DC

## 2022-08-27 MED ORDER — OXYCODONE-ACETAMINOPHEN 5-325 MG PO TABS: 1.0000 | ORAL_TABLET | ORAL | Status: DC | PRN

## 2022-08-27 MED ORDER — SENNOSIDES-DOCUSATE SODIUM 8.6-50 MG PO TABS
2.0000 | ORAL_TABLET | ORAL | Status: DC
  Administered 2022-08-27: 2 via ORAL
  Filled 2022-08-27: qty 2

## 2022-08-27 MED ORDER — LIDOCAINE HCL (PF) 1 % IJ SOLN: 30.0000 mL | INTRAMUSCULAR | Status: DC | PRN

## 2022-08-27 MED ORDER — VALACYCLOVIR HCL 500 MG PO TABS
500.0000 mg | ORAL_TABLET | Freq: Two times a day (BID) | ORAL | Status: DC
  Administered 2022-08-27: 500 mg via ORAL
  Filled 2022-08-27: qty 1

## 2022-08-27 MED ORDER — ZOLPIDEM TARTRATE 5 MG PO TABS: 5.0000 mg | ORAL_TABLET | Freq: Every evening | ORAL | Status: DC | PRN

## 2022-08-27 MED ORDER — DIBUCAINE (PERIANAL) 1 % EX OINT: 1.0000 | TOPICAL_OINTMENT | CUTANEOUS | Status: DC | PRN

## 2022-08-27 MED ORDER — ONDANSETRON HCL 4 MG/2ML IJ SOLN: 4.0000 mg | INTRAMUSCULAR | Status: DC | PRN

## 2022-08-27 MED ORDER — SODIUM CHLORIDE 0.9% FLUSH: 3.0000 mL | Freq: Two times a day (BID) | INTRAVENOUS | Status: DC

## 2022-08-27 MED ORDER — LABETALOL HCL 5 MG/ML IV SOLN: 20.0000 mg | INTRAVENOUS | Status: DC | PRN

## 2022-08-27 MED ORDER — IBUPROFEN 600 MG PO TABS
600.0000 mg | ORAL_TABLET | Freq: Four times a day (QID) | ORAL | Status: DC
  Administered 2022-08-27 – 2022-08-28 (×5): 600 mg via ORAL
  Filled 2022-08-27 (×4): qty 1

## 2022-08-27 MED ORDER — EPHEDRINE 5 MG/ML INJ: 10.0000 mg | INTRAVENOUS | Status: DC | PRN

## 2022-08-27 MED ORDER — TERBUTALINE SULFATE 1 MG/ML IJ SOLN: 0.2500 mg | Freq: Once | INTRAMUSCULAR | Status: DC | PRN

## 2022-08-27 MED ORDER — FLEET ENEMA 7-19 GM/118ML RE ENEM: 1.0000 | ENEMA | Freq: Every day | RECTAL | Status: DC | PRN

## 2022-08-27 MED ORDER — MISOPROSTOL 50MCG HALF TABLET
50.0000 ug | ORAL_TABLET | Freq: Once | ORAL | Status: AC
  Administered 2022-08-27: 50 ug via ORAL
  Filled 2022-08-27: qty 1

## 2022-08-27 MED ORDER — LACTATED RINGERS IV SOLN: INTRAVENOUS | Status: DC

## 2022-08-27 MED ORDER — ONDANSETRON HCL 4 MG/2ML IJ SOLN: 4.0000 mg | Freq: Four times a day (QID) | INTRAMUSCULAR | Status: DC | PRN

## 2022-08-27 MED ORDER — LABETALOL HCL 5 MG/ML IV SOLN: 80.0000 mg | INTRAVENOUS | Status: DC | PRN

## 2022-08-27 MED ORDER — COCONUT OIL OIL: 1.0000 | TOPICAL_OIL | Status: DC | PRN

## 2022-08-27 MED ORDER — ONDANSETRON HCL 4 MG PO TABS: 4.0000 mg | ORAL_TABLET | ORAL | Status: DC | PRN

## 2022-08-27 MED ORDER — MEASLES, MUMPS & RUBELLA VAC IJ SOLR
0.5000 mL | Freq: Once | INTRAMUSCULAR | Status: AC
  Administered 2022-08-28: 0.5 mL via SUBCUTANEOUS
  Filled 2022-08-27: qty 0.5

## 2022-08-27 MED ORDER — HYDROXYZINE HCL 50 MG PO TABS: 50.0000 mg | ORAL_TABLET | Freq: Four times a day (QID) | ORAL | Status: DC | PRN

## 2022-08-27 MED ORDER — SIMETHICONE 80 MG PO CHEW: 80.0000 mg | CHEWABLE_TABLET | ORAL | Status: DC | PRN

## 2022-08-27 MED ORDER — LABETALOL HCL 5 MG/ML IV SOLN: 40.0000 mg | INTRAVENOUS | Status: DC | PRN

## 2022-08-27 MED ORDER — FENTANYL-BUPIVACAINE-NACL 0.5-0.125-0.9 MG/250ML-% EP SOLN
12.0000 mL/h | EPIDURAL | Status: DC | PRN
  Filled 2022-08-27: qty 250

## 2022-08-27 MED ORDER — OXYTOCIN-SODIUM CHLORIDE 30-0.9 UT/500ML-% IV SOLN: 1.0000 m[IU]/min | INTRAVENOUS | Status: DC

## 2022-08-27 MED ORDER — DIPHENHYDRAMINE HCL 25 MG PO CAPS: 25.0000 mg | ORAL_CAPSULE | Freq: Four times a day (QID) | ORAL | Status: DC | PRN

## 2022-08-27 MED ORDER — OXYCODONE-ACETAMINOPHEN 5-325 MG PO TABS: 2.0000 | ORAL_TABLET | ORAL | Status: DC | PRN

## 2022-08-27 MED ORDER — PRENATAL MULTIVITAMIN CH
1.0000 | ORAL_TABLET | Freq: Every day | ORAL | Status: DC
  Administered 2022-08-28: 1 via ORAL
  Filled 2022-08-27: qty 1

## 2022-08-27 MED ORDER — BENZOCAINE-MENTHOL 20-0.5 % EX AERO
1.0000 | INHALATION_SPRAY | CUTANEOUS | Status: DC | PRN
  Administered 2022-08-27: 1 via TOPICAL
  Filled 2022-08-27: qty 56

## 2022-08-27 MED ORDER — PHENYLEPHRINE 80 MCG/ML (10ML) SYRINGE FOR IV PUSH (FOR BLOOD PRESSURE SUPPORT): 80.0000 ug | PREFILLED_SYRINGE | INTRAVENOUS | Status: DC | PRN

## 2022-08-27 MED ORDER — FENTANYL-BUPIVACAINE-NACL 0.5-0.125-0.9 MG/250ML-% EP SOLN
EPIDURAL | Status: DC | PRN
  Administered 2022-08-27: 12 mL/h via EPIDURAL

## 2022-08-27 MED ORDER — NIFEDIPINE ER OSMOTIC RELEASE 30 MG PO TB24
30.0000 mg | ORAL_TABLET | Freq: Every day | ORAL | Status: DC
  Administered 2022-08-27: 30 mg via ORAL
  Filled 2022-08-27: qty 1

## 2022-08-27 MED ORDER — OXYTOCIN-SODIUM CHLORIDE 30-0.9 UT/500ML-% IV SOLN
2.5000 [IU]/h | INTRAVENOUS | Status: DC
  Filled 2022-08-27: qty 500

## 2022-08-27 NOTE — Anesthesia Procedure Notes (Signed)
Epidural Patient location during procedure: OB Start time: 08/27/2022 11:49 AM End time: 08/27/2022 12:01 PM  Staffing Anesthesiologist: Pervis Hocking, DO Performed: anesthesiologist   Preanesthetic Checklist Completed: patient identified, IV checked, risks and benefits discussed, monitors and equipment checked, pre-op evaluation and timeout performed  Epidural Patient position: sitting Prep: DuraPrep and site prepped and draped Patient monitoring: continuous pulse ox, blood pressure, heart rate and cardiac monitor Approach: midline Location: L3-L4 Injection technique: LOR air  Needle:  Needle type: Tuohy  Needle gauge: 17 G Needle length: 9 cm Needle insertion depth: 9 (9+++) cm Catheter type: closed end flexible Catheter size: 19 Gauge Catheter at skin depth: 15 cm Test dose: negative  Assessment Sensory level: T8 Events: blood not aspirated, no cerebrospinal fluid, injection not painful, no injection resistance, no paresthesia and negative IV test  Additional Notes Patient identified. Risks/Benefits/Options discussed with patient including but not limited to bleeding, infection, nerve damage, paralysis, failed block, incomplete pain control, headache, blood pressure changes, nausea, vomiting, reactions to medication both or allergic, itching and postpartum back pain. Confirmed with bedside nurse the patient's most recent platelet count. Confirmed with patient that they are not currently taking any anticoagulation, have any bleeding history or any family history of bleeding disorders. Patient expressed understanding and wished to proceed. All questions were answered. Sterile technique was used throughout the entire procedure. Please see nursing notes for vital signs. Test dose was given through epidural catheter and negative prior to continuing to dose epidural or start infusion. Warning signs of high block given to the patient including shortness of breath, tingling/numbness  in hands, complete motor block, or any concerning symptoms with instructions to call for help. Patient was given instructions on fall risk and not to get out of bed. All questions and concerns addressed with instructions to call with any issues or inadequate analgesia.  Reason for block:procedure for pain

## 2022-08-27 NOTE — Lactation Note (Signed)
This note was copied from a baby's chart. Lactation Consultation Note  Patient Name: Julie Finley Today's Date: 08/27/2022 Age:28 hours P4, Female, ETI Per Birth Parent her current feeding plan is to "Pump only" and formula feed infant she doesn't want to latch infant at the breast. [place] set Birth Parent up with DEBP using 27 mm breast flange and Birth Parent was still expressing colostrum  with DEBP, had expressed 11 mls of EBM as LC was leaving the room. Support Person was bottle feeding infant, given infant 23 mls of 20 kcal formula. Birth Parent understands that EBM is safe at room temperature for 4 hours whereas formula must be used within 1 hour. Birth Parent plans to offer infant her EBM first at next feeding before formula. Day 1 Birth Parent knows infant may consume 10 mls + per feeding. LC gave Birth Parent handout on, " Storage and preparation of Breast milk. "   Current Feeding plan: 1-Birth Parent will continue to feed infant by cues, 8 to 12 times within 24 hours.  2-Continue to pump every 3 hours for 15 minutes on initial setting to help establish her milk supply, offering her EBM first before formula. 3- Birth Parent knows to call [place] if their are any BF questions or concerns.   Maternal Data Does the patient have breastfeeding experience prior to this delivery?: Yes How long did the patient breastfeed?: Per Birth Parent, she BF 1st child for 6 months, 2nd child for 4 weeks and 3rd child for 2 months  Feeding Mother's Current Feeding Choice: Breast Milk and Formula Nipple Type: Slow - flow  LATCH Score                    Lactation Tools Discussed/Used Tools: Pump Breast pump type: Double-Electric Breast Pump Reason for Pumping: Birth Parent decided she wants to" pump only" and formula feed infant does not want to latch infant to breast. Pumping frequency: Birth Parent plans to pump every 3 hours for 15 minutes on inital setting. Pumped volume: 11 mL (Birth  Parent was still expressing colostrum in breast flanges when LC left the room.)  Interventions Interventions: Skin to skin;DEBP;Education;Pace feeding  Discharge Pump: Personal (Per Birth Parent, she has DEBP at home.)  Consult Status Consult Status: Follow-up Date: 08/28/22 Follow-up type: In-patient    Eulis Canner 08/27/2022, 7:19 PM

## 2022-08-27 NOTE — Discharge Summary (Signed)
Postpartum Discharge Summary  Date of Service updated***     Patient Name: Julie Finley DOB: 19-Jan-1996 MRN: [id number]  Date of admission: 08/27/2022 Delivery date:08/27/2022  Delivering provider: Stormy Card  Date of discharge: 08/27/2022  Admitting diagnosis: Gestational hypertension, third trimester [O13.3] Intrauterine pregnancy: [redacted]w[redacted]d    Secondary diagnosis:  Principal Problem:   Vaginal delivery Active Problems:   Anxiety   Rubella non-immune status, antepartum   History of gestational hypertension   Supervision of high-risk pregnancy   Genital herpes   Postpartum hypertension   Gestational hypertension, third trimester  Additional problems: ***    Discharge diagnosis: {DX.:23714}                                              Post partum procedures:{Postpartum procedures:23558} Augmentation: Pitocin, Cytotec, and IP Foley Complications: None  Hospital course: Induction of Labor With Vaginal Delivery   27y.o. yo [id number] 373w3das admitted to the hospital 08/27/2022 for induction of labor.  Indication for induction: Gestational hypertension.  Patient had an labor course complicated by: none Membrane Rupture Time/Date: 2:23 PM ,08/27/2022   Delivery Method:Vaginal, Spontaneous  Episiotomy: None  Lacerations:  None  Details of delivery can be found in separate delivery note.  Patient had a postpartum course complicated by***. Patient is discharged home 08/27/22.  Newborn Data: Birth date:08/27/2022  Birth time:2:23 PM  Gender:Female  Living status:Living  Apgars:8 ,9  Weight:3120 g   Magnesium Sulfate received: No BMZ received: No Rhophylac:N/A MMR:{MMR:30440033}non immune T-DaP:{Tdap:23962} Flu: {FWG:1132360ransfusion:{Transfusion received:30440034}  Physical exam  Vitals:   08/27/22 1535 08/27/22 1640 08/27/22 1735 08/27/22 1930  BP: (!) 140/79 (!) 143/95 (!) 149/86 (!) 157/89  Pulse: 68 (!) 55 72 71  Resp: '16 20 18   '$ Temp:  97.9 F (36.6 C)  98.5 F (36.9 C)   TempSrc:  Axillary Oral   SpO2:  100%  99%  Weight:      Height:       General: {Exam; general:21111117} Lochia: {Desc; appropriate/inappropriate:30686::"appropriate"} Uterine Fundus: {Desc; firm/soft:30687} Incision: {Exam; incision:21111123} DVT Evaluation: {Exam; dvt:2111122} Labs: Lab Results  Component Value Date   WBC 8.6 08/27/2022   HGB 12.0 08/27/2022   HCT 37.9 08/27/2022   MCV 78.8 (L) 08/27/2022   PLT 300 08/27/2022      Latest Ref Rng & Units 08/27/2022    7:28 AM  CMP  Glucose 70 - 99 mg/dL 106   BUN 6 - 20 mg/dL 7   Creatinine 0.44 - 1.00 mg/dL 0.70   Sodium 135 - 145 mmol/L 135   Potassium 3.5 - 5.1 mmol/L 3.4   Chloride 98 - 111 mmol/L 108   CO2 22 - 32 mmol/L 18   Calcium 8.9 - 10.3 mg/dL 8.4   Total Protein 6.5 - 8.1 g/dL 5.8   Total Bilirubin 0.3 - 1.2 mg/dL 0.3   Alkaline Phos 38 - 126 U/L 118   AST 15 - 41 U/L 21   ALT 0 - 44 U/L 15    Edinburgh Score:    28/24/2023    2:45 PM  Edinburgh Postnatal Depression Scale Screening Tool  I have been able to laugh and see the funny side of things. 0  I have looked forward with enjoyment to things. 0  I have blamed myself unnecessarily when things went wrong. 0  I  have been anxious or worried for no good reason. 0  I have felt scared or panicky for no good reason. 1  Things have been getting on top of me. 1  I have been so unhappy that I have had difficulty sleeping. 0  I have felt sad or miserable. 0  I have been so unhappy that I have been crying. 0  The thought of harming myself has occurred to me. 0  Edinburgh Postnatal Depression Scale Total 2     After visit meds:  Allergies as of 08/27/2022   No Known Allergies   Med Rec must be completed prior to using this [facility]***        Discharge home in stable condition Infant Feeding: {Baby feeding:23562} Infant Disposition:{CHL IP OB HOME WITH DX:[id number] Discharge instruction: per After Visit Summary and Postpartum  booklet. Activity: Advance as tolerated. Pelvic rest for 6 weeks.  Diet: {OB BY:630183 Future Appointments: Future Appointments  Date Time Provider [place]  08/30/2022  8:55 AM Griffin Basil, MD [facility]  09/06/2022 10:15 AM Aletha Halim, MD [facility]  09/13/2022 10:15 AM WMC-WOCA NST [facility]  09/13/2022 11:15 AM Radene Gunning, MD [facility]   Follow up Visit:  The following message was sent to [facility] by Mikki Santee, MD  Please schedule this patient for a In person postpartum visit in 6 weeks with the following provider: Any provider. Additional Postpartum F/U:BP check 1 week  High risk pregnancy complicated by: HTN Delivery mode:  Vaginal, Spontaneous  Anticipated Birth Control:  IUD   08/27/2022 Stormy Card, MD

## 2022-08-27 NOTE — Anesthesia Preprocedure Evaluation (Signed)
Anesthesia Evaluation  Patient identified by MRN, date of birth, ID band Patient awake    Reviewed: Allergy & Precautions, Patient's Chart, lab work & pertinent test results  Airway Mallampati: III  TM Distance: >3 FB Neck ROM: Full    Dental no notable dental hx.    Pulmonary neg pulmonary ROS, former smoker   Pulmonary exam normal breath sounds clear to auscultation       Cardiovascular hypertension (PIH), Normal cardiovascular exam Rhythm:Regular Rate:Normal     Neuro/Psych  PSYCHIATRIC DISORDERS Anxiety     negative neurological ROS     GI/Hepatic negative GI ROS, Neg liver ROS,,,  Endo/Other    Morbid obesityBMI 47  Renal/GU negative Renal ROS  negative genitourinary   Musculoskeletal negative musculoskeletal ROS (+)    Abdominal  (+) + obese  Peds negative pediatric ROS (+)  Hematology negative hematology ROS (+) Hb 12, plt 300   Anesthesia Other Findings   Reproductive/Obstetrics (+) Pregnancy                             Anesthesia Physical Anesthesia Plan  ASA: 3  Anesthesia Plan: Epidural   Post-op Pain Management:    Induction:   PONV Risk Score and Plan: 2  Airway Management Planned: Natural Airway  Additional Equipment: None  Intra-op Plan:   Post-operative Plan:   Informed Consent: I have reviewed the patients History and Physical, chart, labs and discussed the procedure including the risks, benefits and alternatives for the proposed anesthesia with the patient or authorized representative who has indicated his/her understanding and acceptance.       Plan Discussed with:   Anesthesia Plan Comments:        Anesthesia Quick Evaluation

## 2022-08-27 NOTE — H&P (Incomplete Revision)
OBSTETRIC ADMISSION HISTORY AND PHYSICAL  Lehua Eden is a 27 y.o. female [phone] with IUP at 33w3dby [place] presenting for IOL for gHTN. She reports +FMs, No LOF, no VB, no blurry vision, headaches or peripheral edema, and RUQ pain.  She plans to breast and bottle feed. She requests outpatient IUD for birth control. She [facility] [place]  Estimated Date of Delivery: 09/07/22  Sono:    '@[redacted]w[redacted]d'$ , CWD, normal anatomy, cephalic presentation, posterior placenta, 2960g, 56% EFW   Prenatal History/Complications: gestational vs chornic HTN, maternal obesity, late to prenatal care (est care 35 wks), HSV on suppressive tx  Past Medical History: Past Medical History:  Diagnosis Date   Anxiety    GBS (group B Streptococcus carrier), +RV culture, currently pregnant 05/28/2021   Genital herpes    Gestational hypertension 05/04/2021   Medical history non-contributory    Post term pregnancy at 469weeks gestation 08/21/2016   Pregnancy induced hypertension    had postpartum magnesium   Pyelonephritis 2016   Vaginal delivery 12/07/2014    Past Surgical History: Past Surgical History:  Procedure Laterality Date   NO PAST SURGERIES      Obstetrical History: OB History     Gravida  4   Para  3   Term  3   Preterm  0   AB  0   Living  3      SAB  0   IAB  0   Ectopic  0   Multiple  0   Live Births  3           Social History Social History   Socioeconomic History   Marital status: Single    Spouse name: Not on file   Number of children: Not on file   Years of education: Not on file   Highest education level: Not on file  Occupational History   Occupation: housekeeper  Tobacco Use   Smoking status: Former    Types: Cigars   Smokeless tobacco: Never  VScientific laboratory technicianUse: Never used  Substance and Sexual Activity   Alcohol use: Not Currently    Comment: occasional   Drug use: No   Sexual activity: Yes    Birth  control/protection: None  Other Topics Concern   Not on file  Social History Narrative   Not on file   Social Determinants of Health   Financial Resource Strain: Not on file  Food Insecurity: No Food Insecurity (08/27/2022)   Hunger Vital Sign    Worried About Running Out of Food in the Last Year: Never true    Ran Out of Food in the Last Year: Never true  Transportation Needs: No Transportation Needs (08/27/2022)   PRAPARE - THydrologist(Medical): No    Lack of Transportation (Non-Medical): No  Physical Activity: Not on file  Stress: Not on file  Social Connections: Not on file    Family History: Family History  Problem Relation Age of Onset   Hypertension Mother    Asthma Son    Diabetes Maternal Grandmother    Cancer Neg Hx    Heart disease Neg Hx     Allergies: No Known Allergies  Medications Prior to Admission  Medication Sig Dispense Refill Last Dose   aspirin EC 81 MG tablet Take 1 tablet (81 mg total) by mouth daily. Take after 12 weeks for prevention of  preeclampssia later in pregnancy 300 tablet 2 08/26/2022   Prenatal Vit-Fe Fumarate-FA (MULTIVITAMIN-PRENATAL) 27-0.8 MG TABS tablet Take 1 tablet by mouth daily at 12 noon.   08/26/2022   valACYclovir (VALTREX) 500 MG tablet Take 1 tablet (500 mg total) by mouth 2 (two) times daily. 30 tablet 0 08/26/2022   acetaminophen (TYLENOL) 500 MG tablet Take 2 tablets (1,000 mg total) by mouth every 8 (eight) hours as needed (pain). (Patient not taking: Reported on 08/09/2022) 60 tablet 0      Review of Systems   All systems reviewed and negative except as stated in HPI  Blood pressure (!) 154/93, pulse 70, temperature 98.1 F (36.7 C), temperature source Oral, resp. rate 18, height '5\' 5"'$  (1.651 m), weight 128.1 kg, SpO2 100 %, currently breastfeeding. General appearance: alert, cooperative, and no distress Lungs: clear to auscultation bilaterally Heart: regular rate and rhythm Abdomen: soft,  non-tender; bowel sounds normal Pelvic: performed by other provider Extremities: Homans sign is negative, no sign of DVT Presentation: cephalic Fetal monitoringBaseline: 135 bpm, Variability: Good {> 6 bpm), Accelerations: Reactive, and Decelerations: Variable: moderate with recovery Uterine activityFrequency: Every 5-9.5 minutes Dilation: 4.5 Effacement (%): 80 Station: -3 Exam by:: Psychologist, clinical RN   Prenatal labs: ABO, Rh: --/--/O POS (03/01 [id number]) Antibody: NEG (03/01 0727) Rubella: <0.90 (02/12 1615) RPR: NON REACTIVE (03/01 0728)  HBsAg: Negative (02/12 1615)  HIV: Non Reactive (02/12 1615)  GBS: Negative/-- (02/12 1600)  1 hr Glucola normal Genetic screening  not done due to late prenatal care  Anatomy US normal  Prenatal Transfer Tool  Maternal Diabetes: No Genetic Screening: Declined Maternal Ultrasounds/Referrals: Normal Fetal Ultrasounds or other Referrals:  None Maternal Substance Abuse:  No Significant Maternal Medications:  Meds include: Other: nifedipine  Significant Maternal Lab Results:  Group B Strep negative and Other: Rubella NI Number of Prenatal Visits:Less than or equal to 3 verified prenatal visits Other Comments:   HSV on suppression  Results for orders placed or performed during the hospital encounter of 08/27/22 (from the past 24 hour(s))  Type and screen   Collection Time: 08/27/22  7:27 AM  Result Value Ref Range   ABO/RH(D) O POS    Antibody Screen NEG    Sample Expiration      08/30/2022,2359 Performed at [facility], [place] [address], [place], [place] [address]   CBC   Collection Time: 08/27/22  7:28 AM  Result Value Ref Range   WBC 8.6 4.0 - 10.5 K/uL   RBC 4.81 3.87 - 5.11 MIL/uL   Hemoglobin 12.0 12.0 - 15.0 g/dL   HCT 37.9 36.0 - 46.0 %   MCV 78.8 (L) 80.0 - 100.0 fL   MCH 24.9 (L) 26.0 - 34.0 pg   MCHC 31.7 30.0 - 36.0 g/dL   RDW 15.6 (H) 11.5 - 15.5 %   Platelets 300 150 - 400 K/uL   nRBC 0.0 0.0 - 0.2 %  RPR   Collection  Time: 08/27/22  7:28 AM  Result Value Ref Range   RPR Ser Ql NON REACTIVE NON REACTIVE  Comprehensive metabolic panel   Collection Time: 08/27/22  7:28 AM  Result Value Ref Range   Sodium 135 135 - 145 mmol/L   Potassium 3.4 (L) 3.5 - 5.1 mmol/L   Chloride 108 98 - 111 mmol/L   CO2 18 (L) 22 - 32 mmol/L   Glucose, Bld 106 (H) 70 - 99 mg/dL   BUN 7 6 - 20 mg/dL   Creatinine, Ser  0.70 0.44 - 1.00 mg/dL   Calcium 8.4 (L) 8.9 - 10.3 mg/dL   Total Protein 5.8 (L) 6.5 - 8.1 g/dL   Albumin 2.3 (L) 3.5 - 5.0 g/dL   AST 21 15 - 41 U/L   ALT 15 0 - 44 U/L   Alkaline Phosphatase 118 38 - 126 U/L   Total Bilirubin 0.3 0.3 - 1.2 mg/dL   GFR, Estimated >60 >60 mL/min   Anion gap 9 5 - 15  Protein / creatinine ratio, urine   Collection Time: 08/27/22  8:40 AM  Result Value Ref Range   Creatinine, Urine 43 mg/dL   Total Protein, Urine <6 mg/dL   Protein Creatinine Ratio        0.00 - 0.15 mg/mg[Cre]    Patient Active Problem List   Diagnosis Date Noted   Gestational hypertension, third trimester 08/27/2022   Late prenatal care affecting pregnancy in third trimester 08/20/2022   CHTN vs GHTN (Feb 2024) 08/12/2022   BMI 40.0-44.9, adult ([place]) 08/09/2022   Obesity in pregnancy, antepartum, third trimester 08/09/2022   Short interval between pregnancies affecting pregnancy, antepartum 06/09/2022   Supervision of high-risk pregnancy 05/25/2022   Genital herpes 05/25/2022   History of gestational hypertension 05/04/2022   Rubella non-immune status, antepartum 05/25/2021   Anxiety     Assessment/Plan:  Redith Macia is a 27 y.o. G4P3003 at 71w3dhere for IOL due to gHTN  #Labor: Begin induction with dual cytotec, FB.  #Pain: Epidural upon request #FWB: Cat II but good recovery to baseline #ID:  GBS neg; HSV - continue Valtrex while in labor #MOF: breast and bottle #MOC: outpt IUD #Circ:  Yes if boy  LDarlen Round MD PGY-1 08/27/2022, 12:50 PM

## 2022-08-27 NOTE — H&P (Addendum)
OBSTETRIC ADMISSION HISTORY AND PHYSICAL  Julie Finley is a 27 y.o. female [phone] with IUP at [redacted]w[redacted]d by [place]  presenting for IOL for gHTN. She reports +FMs, No LOF, no VB, no blurry vision, headaches or peripheral edema, and RUQ pain.  She plans to breast and bottle feed. She requests outpatient IUD for birth control. She received her prenatal care at  [facility]    Dating: [place] --->  Estimated Date of Delivery: 09/07/22  Sono:    @[redacted]w[redacted]d , CWD, normal anatomy, cephalic presentation, posterior placenta, 2960g, 56% EFW   Prenatal History/Complications: gestational vs chornic HTN, maternal obesity, late to prenatal care (est care 35 wks), HSV on suppressive tx  Past Medical History: Past Medical History:  Diagnosis Date   Anxiety    GBS (group B Streptococcus carrier), +RV culture, currently pregnant 05/28/2021   Genital herpes    Gestational hypertension 05/04/2021   Medical history non-contributory    Post term pregnancy at [redacted] weeks gestation 08/21/2016   Pregnancy induced hypertension    had postpartum magnesium   Pyelonephritis 2016   Vaginal delivery 12/07/2014    Past Surgical History: Past Surgical History:  Procedure Laterality Date   NO PAST SURGERIES      Obstetrical History: OB History     Gravida  4   Para  3   Term  3   Preterm  0   AB  0   Living  3      SAB  0   IAB  0   Ectopic  0   Multiple  0   Live Births  3           Social History Social History   Socioeconomic History   Marital status: Single    Spouse name: Not on file   Number of children: Not on file   Years of education: Not on file   Highest education level: Not on file  Occupational History   Occupation: housekeeper  Tobacco Use   Smoking status: Former    Types: Cigars   Smokeless tobacco: Never  Building services engineer Use: Never used  Substance and Sexual Activity   Alcohol use: Not Currently    Comment: occasional   Drug use: No   Sexual activity: Yes    Birth  control/protection: None  Other Topics Concern   Not on file  Social History Narrative   Not on file   Social Determinants of Health   Financial Resource Strain: Not on file  Food Insecurity: No Food Insecurity (08/27/2022)   Hunger Vital Sign    Worried About Running Out of Food in the Last Year: Never true    Ran Out of Food in the Last Year: Never true  Transportation Needs: No Transportation Needs (08/27/2022)   PRAPARE - Administrator, Civil Service (Medical): No    Lack of Transportation (Non-Medical): No  Physical Activity: Not on file  Stress: Not on file  Social Connections: Not on file    Family History: Family History  Problem Relation Age of Onset   Hypertension Mother    Asthma Son    Diabetes Maternal Grandmother    Cancer Neg Hx    Heart disease Neg Hx     Allergies: No Known Allergies  Medications Prior to Admission  Medication Sig Dispense Refill Last Dose   aspirin EC 81 MG tablet Take 1 tablet (81 mg total) by mouth daily. Take after 12 weeks for prevention of  preeclampssia later in pregnancy 300 tablet 2 08/26/2022   Prenatal Vit-Fe Fumarate-FA (MULTIVITAMIN-PRENATAL) 27-0.8 MG TABS tablet Take 1 tablet by mouth daily at 12 noon.   08/26/2022   valACYclovir (VALTREX) 500 MG tablet Take 1 tablet (500 mg total) by mouth 2 (two) times daily. 30 tablet 0 08/26/2022   acetaminophen (TYLENOL) 500 MG tablet Take 2 tablets (1,000 mg total) by mouth every 8 (eight) hours as needed (pain). (Patient not taking: Reported on 08/09/2022) 60 tablet 0      Review of Systems   All systems reviewed and negative except as stated in HPI  Blood pressure (!) 154/93, pulse 70, temperature 98.1 F (36.7 C), temperature source Oral, resp. rate 18, height 5\' 5"  (1.651 m), weight 128.1 kg, SpO2 100 %, currently breastfeeding. General appearance: alert, cooperative, and no distress Lungs: clear to auscultation bilaterally Heart: regular rate and rhythm Abdomen: soft,  non-tender; bowel sounds normal Pelvic: performed by other provider Extremities: Homans sign is negative, no sign of DVT Presentation: cephalic Fetal monitoringBaseline: 135 bpm, Variability: Good {> 6 bpm), Accelerations: Reactive, and Decelerations: Variable: moderate with recovery Uterine activityFrequency: Every 5-9.5 minutes Dilation: 4.5 Effacement (%): 80 Station: -3 Exam by:: Tax adviser RN   Prenatal labs: ABO, Rh: --/--/O POS (03/01 1610) Antibody: NEG (03/01 0727) Rubella: <0.90 (02/12 1615) RPR: NON REACTIVE (03/01 0728)  HBsAg: Negative (02/12 1615)  HIV: Non Reactive (02/12 1615)  GBS: Negative/-- (02/12 1600)  1 hr Glucola normal Genetic screening  not done due to late prenatal care  Anatomy US normal  Prenatal Transfer Tool  Maternal Diabetes: No Genetic Screening: Declined Maternal Ultrasounds/Referrals: Normal Fetal Ultrasounds or other Referrals:  None Maternal Substance Abuse:  No Significant Maternal Medications:  Meds include: Other: nifedipine  Significant Maternal Lab Results:  Group B Strep negative and Other: Rubella NI Number of Prenatal Visits:Less than or equal to 3 verified prenatal visits Other Comments:   HSV on suppression  Results for orders placed or performed during the hospital encounter of 08/27/22 (from the past 24 hour(s))  Type and screen   Collection Time: 08/27/22  7:27 AM  Result Value Ref Range   ABO/RH(D) O POS    Antibody Screen NEG    Sample Expiration      08/30/2022,2359 Performed at [facility], [address] [place], [place] [address]   CBC   Collection Time: 08/27/22  7:28 AM  Result Value Ref Range   WBC 8.6 4.0 - 10.5 K/uL   RBC 4.81 3.87 - 5.11 MIL/uL   Hemoglobin 12.0 12.0 - 15.0 g/dL   HCT 40.9 81.1 - 91.4 %   MCV 78.8 (L) 80.0 - 100.0 fL   MCH 24.9 (L) 26.0 - 34.0 pg   MCHC 31.7 30.0 - 36.0 g/dL   RDW 78.2 (H) 95.6 - 21.3 %   Platelets 300 150 - 400 K/uL   nRBC 0.0 0.0 - 0.2 %  RPR   Collection  Time: 08/27/22  7:28 AM  Result Value Ref Range   RPR Ser Ql NON REACTIVE NON REACTIVE  Comprehensive metabolic panel   Collection Time: 08/27/22  7:28 AM  Result Value Ref Range   Sodium 135 135 - 145 mmol/L   Potassium 3.4 (L) 3.5 - 5.1 mmol/L   Chloride 108 98 - 111 mmol/L   CO2 18 (L) 22 - 32 mmol/L   Glucose, Bld 106 (H) 70 - 99 mg/dL   BUN 7 6 - 20 mg/dL   Creatinine, Ser  0.70 0.44 - 1.00 mg/dL   Calcium 8.4 (L) 8.9 - 10.3 mg/dL   Total Protein 5.8 (L) 6.5 - 8.1 g/dL   Albumin 2.3 (L) 3.5 - 5.0 g/dL   AST 21 15 - 41 U/L   ALT 15 0 - 44 U/L   Alkaline Phosphatase 118 38 - 126 U/L   Total Bilirubin 0.3 0.3 - 1.2 mg/dL   GFR, Estimated >16 >10 mL/min   Anion gap 9 5 - 15  Protein / creatinine ratio, urine   Collection Time: 08/27/22  8:40 AM  Result Value Ref Range   Creatinine, Urine 43 mg/dL   Total Protein, Urine <6 mg/dL   Protein Creatinine Ratio        0.00 - 0.15 mg/mg[Cre]    Patient Active Problem List   Diagnosis Date Noted   Gestational hypertension, third trimester 08/27/2022   Late prenatal care affecting pregnancy in third trimester 08/20/2022   CHTN vs GHTN (Feb 2024) 08/12/2022   BMI 40.0-44.9, adult ([facility]) 08/09/2022   Obesity in pregnancy, antepartum, third trimester 08/09/2022   Short interval between pregnancies affecting pregnancy, antepartum 06/09/2022   Supervision of high-risk pregnancy 05/25/2022   Genital herpes 05/25/2022   History of gestational hypertension 05/04/2022   Rubella non-immune status, antepartum 05/25/2021   Anxiety     Assessment/Plan:  Julie Finley is a 27 y.o. G4P3003 at [redacted]w[redacted]d here for IOL due to gHTN  #Labor: Begin induction with dual cytotec, FB.  #Pain: Epidural upon request #FWB: Cat II but good recovery to baseline #ID:  GBS neg; HSV - continue Valtrex while in labor #MOF: breast and bottle #MOC: outpt IUD #Circ:  Yes if boy  Martin Majestic, MD PGY-1 08/27/2022, 12:50 PM   Fellow Attestation  I saw and  evaluated the patient, performing the key elements of the service.I  personally performed or re-performed the history, physical exam, and medical decision making activities of this service and have verified that the service and findings are accurately documented in the resident's note. I developed the management plan that is described in the resident's note, and I agree with the content, with my edits above.   Alfredia Ferguson, MD,MPH OB Fellow, Faculty Practice  08/28/2022 7:18 AM

## 2022-08-28 MED ORDER — FUROSEMIDE 20 MG PO TABS: 20.0000 mg | ORAL_TABLET | Freq: Every day | ORAL | 0 refills | Status: DC

## 2022-08-28 MED ORDER — NIFEDIPINE ER OSMOTIC RELEASE 30 MG PO TB24
60.0000 mg | ORAL_TABLET | Freq: Every day | ORAL | Status: DC
  Administered 2022-08-28: 60 mg via ORAL
  Filled 2022-08-28: qty 2

## 2022-08-28 MED ORDER — NIFEDIPINE ER 60 MG PO TB24: 60.0000 mg | ORAL_TABLET | Freq: Every day | ORAL | 0 refills | Status: DC

## 2022-08-28 NOTE — Clinical Social Work Maternal (Signed)
CLINICAL SOCIAL WORK MATERNAL/CHILD NOTE  Patient Details  Name: Gurneet Prak MRN: [id number] Date of Birth: 01-10-1996  Date:  08/28/2022  Clinical Social Worker Initiating Note:  Idamae Lusher, LCSWA Date/Time: Initiated:  08/28/22/1649     Child's Name:  Duane Boston Grooms   Biological Parents:  Mother, Father (FOB: Freddie Grooms)   Need for Interpreter:  None   Reason for Referral:  Behavioral Health Concerns, Late or No Prenatal Care     Address: [address] [place], [place] [address]   Phone number:  [phone] (home)     Additional phone number:   Household Members/Support Persons (HM/SP):   Household Member/Support Person 1, Household Member/Support Person 2, Household Member/Support Person 3   HM/SP Name Relationship DOB or Age  HM/SP -1 Shelbie Hutching Son 12/07/2014  HM/SP -2 Dalilah Haywood Daughter 08/21/2016  HM/SP -3 Evertyn Grooms Daughter 05/30/2021  HM/SP -4        HM/SP -5        HM/SP -6        HM/SP -7        HM/SP -8          Natural Supports (not living in the home):  Spouse/significant other, Immediate Family   Professional Supports: None   Employment: Animator   Type of Work: Aeronautical engineer   Education:  [place] arranged:    Museum/gallery curator Resources:  [employer]   Other Resources:  Physicist, medical  , [place] Considerations Which May Impact Care:  None identified  Strengths:  Ability to meet basic needs  , Home prepared for child  , Pediatrician chosen   Psychotropic Medications:         Pediatrician:    [facility] area  Pediatrician List:   [place]      Pediatrician Fax Number:    Risk Factors/Current Problems:  Mental Health Concerns     Cognitive State:  Able to Concentrate  , Alert  , Goal Oriented  , Linear Thinking     Mood/Affect:  Calm  ,  Comfortable  , Interested  , Euthymic     CSW Assessment: CSW was consulted due to history of anxiety and limited prenatal care. CSW met with MOB at bedside to complete assessment. When CSW entered room, FOB was present. MOB was observed sitting in hospital bed. Infant was asleep on back nearby CSW introduced self and requested to speak with MOB alone. MOB provided verbal consent to complete consult with FOB present. CSW explained reasons for consult. MOB presented as calm, was agreeable to consult and remained engaged throughout encounter.   CSW collected demographic information. MOB states the address listed on file is not correct. MOB states she currently resides at [address], [place], [place] [address] with her 3 children.  CSW inquired about MOB's mental health history. MOB states she was diagnosed with anxiety in 2016/2017, stating that she has not experienced anxiety symptoms since this time. MOB denied recent mental health symptoms. MOB denied a history of postpartum depression after the birth of her other children. MOB identified FOB and family as supports. MOB denied current SI/HI.  CSW provided education regarding the baby blues period vs. perinatal mood disorders, discussed treatment and gave resources for mental health follow up if concerns arise.  CSW recommends self-evaluation  during the postpartum time period using the New Mom Checklist from Postpartum Progress and encouraged MOB to contact a medical professional if symptoms are noted at any time.    CSW informed MOB about hospital drug screen policy due to limited prenatal care. CSW explained that infant's UDS was not collected; however, infant's CDS would be monitored and a CPS report would be made if warranted. CSW expressed understanding. CSW inquired about substance use/barriers to prenatal care during pregnancy. MOB denied using illicit substances during pregnancy. MOB states that she does not have a reason she received limited  prenatal care. MOB denied transportation barriers. MOB denied a history of prior CPS involvement.  MOB reports she has all needed items for infant, including a car seat and bassinet.  CSW provided review of Sudden Infant Death Syndrome (SIDS) precautions. MOB denied additional resource needs at this time.  CSW identifies no further need for intervention and no barriers to discharge at this time.  CSW Plan/Description:  No Further Intervention Required/No Barriers to Discharge, Sudden Infant Death Syndrome (SIDS) Education, Perinatal Mood and Anxiety Disorder (PMADs) Education, [place], CSW Will Continue to Monitor Umbilical Cord Tissue Drug Screen Results and Make Report if Herma Carson, LCSWA 08/28/2022, 4:59 PM

## 2022-08-28 NOTE — Discharge Instructions (Signed)
-   Continue your prenatal vitamins especially if breastfeeding - Try to eat iron rich food. - Take over the counter tylenol ('500mg'$ ) or ibuprofen ('200mg'$ ) three times a day as needed for cramping/pain. - continue blood pressure medication - procardia '60mg'$  daily - Take water pill (lasix) as prescribed for a total of 5 days. - follow up in clinic in 1 week for a blood pressure check and in 4-6 weeks as scheduled for your regular post partum visit. - Please come back to [facility] if you notice persistently elevated blood pressures or you start to have a headache, that doesn't get better with medications (tylenol and ibuprofen), rest (4hrs of sleep) and drinking water.

## 2022-08-28 NOTE — Anesthesia Postprocedure Evaluation (Signed)
Anesthesia Post Note  Patient: Julie Finley  Procedure(s) Performed: AN AD [place]     Patient location during evaluation: Mother Baby Anesthesia Type: Epidural Level of consciousness: awake and alert Pain management: pain level controlled Vital Signs Assessment: post-procedure vital signs reviewed and stable Respiratory status: spontaneous breathing, nonlabored ventilation and respiratory function stable Cardiovascular status: stable Postop Assessment: no headache, no backache and epidural receding Anesthetic complications: no   No notable events documented.  Last Vitals:  Vitals:   08/28/22 0132 08/28/22 0555  BP: (!) 144/86 (!) 151/91  Pulse: 86 86  Resp: 18 18  Temp: 36.8 C 36.8 C  SpO2: 100% 99%    Last Pain:  Vitals:   08/28/22 0555  TempSrc: Oral  PainSc:    Pain Goal:                   Julie Finley

## 2022-08-29 ENCOUNTER — Other Ambulatory Visit: Payer: Self-pay | Admitting: Family Medicine

## 2022-08-30 ENCOUNTER — Encounter: Payer: Medicaid Other | Admitting: Obstetrics and Gynecology

## 2022-08-30 ENCOUNTER — Encounter: Payer: Medicaid Other | Admitting: Family Medicine

## 2022-08-30 NOTE — Progress Notes (Signed)
Alert received through NCNotify system as follows: Patient identified as not having a recommended prenatal visit. Patient is 38 weeks and 4 days pregnant. Most recent prenatal visit was on 08/13/2022 at [facility].  Chart review: Patient's last visit was on 08/09/2022. Pt delivered on 08/29/2022. Patient has nurse visit scheduled for 03/11 and postpartum appointment scheduled for 10/12/2022.  No action needed.  Martina Sinner, RN 08/30/2022  12:05 PM

## 2022-09-06 ENCOUNTER — Other Ambulatory Visit: Payer: Self-pay

## 2022-09-06 ENCOUNTER — Ambulatory Visit (INDEPENDENT_AMBULATORY_CARE_PROVIDER_SITE_OTHER): Payer: Medicaid Other

## 2022-09-06 ENCOUNTER — Encounter: Payer: Self-pay | Admitting: Obstetrics and Gynecology

## 2022-09-06 VITALS — BP 129/83 | Wt 255.6 lb

## 2022-09-06 DIAGNOSIS — Z0130 Encounter for examination of blood pressure without abnormal findings: Secondary | ICD-10-CM

## 2022-09-06 NOTE — Progress Notes (Unsigned)
Blood Pressure Check Visit  Julie Finley is here for blood pressure check following spontaneous vaginal birth on 08/29/22 in setting of gestational htn. BP today is 129/83. Completed Lasix 20 mg 4 day course following discharge. Has not been taking Nifedipine, was not aware this was prescribed. Reviewed symptoms of elevated BP. Pt has home BP cuff and will check BP with any symptoms. Will follow up at 10/12/22 at [facility] visit unless she develops symptoms prior.   Annabell Howells, RN 09/06/2022  3:15 PM

## 2022-09-13 ENCOUNTER — Other Ambulatory Visit: Payer: Self-pay

## 2022-09-13 ENCOUNTER — Encounter: Payer: Self-pay | Admitting: Obstetrics and Gynecology

## 2022-09-23 NOTE — Progress Notes (Signed)
Alert received through NCNotify system on 08/30/22  as follows: Patient identified as having high BP reading (146/88) on 08/20/2022  Chart review: Blood pressure was rechecked on 09/06/22 and was normal. Will return for PP visit on  10/12/22.  No action needed.  Annabell Howells, RN 09/23/2022  9:09 AM

## 2022-10-12 ENCOUNTER — Ambulatory Visit: Payer: Self-pay | Admitting: Family Medicine

## 2022-10-27 ENCOUNTER — Ambulatory Visit (INDEPENDENT_AMBULATORY_CARE_PROVIDER_SITE_OTHER): Payer: Medicaid Other | Admitting: Obstetrics & Gynecology

## 2022-10-27 ENCOUNTER — Other Ambulatory Visit: Payer: Self-pay

## 2022-10-27 ENCOUNTER — Encounter: Payer: Self-pay | Admitting: Obstetrics & Gynecology

## 2022-10-27 DIAGNOSIS — Z8759 Personal history of other complications of pregnancy, childbirth and the puerperium: Secondary | ICD-10-CM

## 2022-10-27 NOTE — Patient Instructions (Signed)
Advise no unprotected intercourse and also to premedicate using Ibuprofen and Tylenol before placement

## 2022-10-27 NOTE — Progress Notes (Signed)
Post Partum Visit Note  Julie Finley is a 27 y.o. [id number] female who presents for a postpartum visit. She is 8 weeks postpartum following a vaginal delivery at [redacted]w[redacted]d after IOL for GHTN.  I have fully reviewed the prenatal and intrapartum course.  Anesthesia: none. Postpartum course has been good. Baby is doing well. Baby is feeding by both breast and bottle - Similac Advance. Bleeding no bleeding. Bowel function is normal. Bladder function is normal. Patient is not sexually active. Contraception method is none. Postpartum depression screening: negative.   Upstream - 10/27/22 1414       Pregnancy Intention Screening   Does the patient want to become pregnant in the next year? No    Does the patient's partner want to become pregnant in the next year? No    Would the patient like to discuss contraceptive options today? Yes            The pregnancy intention screening data noted above was reviewed. Potential methods of contraception were discussed. The patient elected to proceed with No data recorded.   Edinburgh Postnatal Depression Scale - 10/27/22 1414       Edinburgh Postnatal Depression Scale:  In the Past 7 Days   I have been able to laugh and see the funny side of things. 0    I have looked forward with enjoyment to things. 0    I have blamed myself unnecessarily when things went wrong. 3    I have been anxious or worried for no good reason. 0    I have felt scared or panicky for no good reason. 3    Things have been getting on top of me. 0    I have been so unhappy that I have had difficulty sleeping. 0    I have felt sad or miserable. 0    I have been so unhappy that I have been crying. 0    The thought of harming myself has occurred to me. 0    Edinburgh Postnatal Depression Scale Total 6             Health Maintenance Due  Topic Date Due   COVID-19 Vaccine (1) Never done    The following portions of the patient's history were reviewed and updated as  appropriate: allergies, current medications, past family history, past medical history, past social history, past surgical history, and problem list.  Review of Systems Pertinent items noted in HPI and remainder of comprehensive ROS otherwise negative.  Objective:  BP 110/87   Pulse 72   Wt 259 lb 6 oz (117.7 kg)   LMP  (Approximate)   BMI 43.16 kg/m    General:  alert and no distress   Breasts:  normal, done in presence of chaperone  Lungs: clear to auscultation bilaterally  Heart:  regular rate and rhythm  Abdomen: soft, non-tender; bowel sounds normal; no masses,  no organomegaly   GU exam:  not indicated       Assessment:   Normal postpartum exam.   Plan:   Essential components of care per ACOG recommendations:  1.  Mood and well being: Patient with negative depression screening today. Reviewed local resources for support.  - Patient tobacco use? No.   - hx of drug use? No.    2. Infant care and feeding:  -Patient currently breastmilk feeding? Yes. Reviewed importance of draining breast regularly to support lactation.  -Social determinants of health (SDOH) reviewed in EPIC. No concerns.  3. Sexuality, contraception and birth spacing - Patient does not want a pregnancy in the next year.  Desired family size is 4 children.  - Reviewed reproductive life planning. Reviewed contraceptive methods based on pt preferences and effectiveness.  Patient desired IUD or IUS, will return for Mirena placement at another date (with kids today, unable to do this today).  Advised no unprotected intercourse and also to premedicate using Ibuprofen and Tylenol before placement. Information given about IUD insertion. - Discussed birth spacing of 18 months  4. Sleep and fatigue -Encouraged family/partner/community support of 4 hrs of uninterrupted sleep to help with mood and fatigue.  5. Physical Recovery  - Discussed patients delivery and complications. She describes her labor as fast and  good. - Patient had a Vaginal, no problems at delivery. Patient had  no laceration. Perineal healing reviewed. Patient expressed understanding - Patient has urinary incontinence? No. - Patient is safe to resume physical and sexual activity  6.  Health Maintenance - HM due items addressed Yes - Last pap smear 01/26/2021 was normal -Breast Cancer screening indicated? No.   7. Chronic Disease/Pregnancy Condition follow up:  GHTN - Normal BP now - PCP follow up  Jaynie Collins, MD [facility], [facility]

## 2022-11-18 ENCOUNTER — Ambulatory Visit: Payer: Medicaid Other | Admitting: Obstetrics and Gynecology

## 2023-11-01 ENCOUNTER — Ambulatory Visit
Admission: RE | Admit: 2023-11-01 | Discharge: 2023-11-01 | Disposition: A | Discharge status: Home / Self Care | Payer: Self-pay | Source: Ambulatory Visit

## 2023-11-01 ENCOUNTER — Other Ambulatory Visit: Payer: Self-pay

## 2023-11-01 DIAGNOSIS — S134XXA Sprain of ligaments of cervical spine, initial encounter: Secondary | ICD-10-CM

## 2023-11-01 DIAGNOSIS — M25512 Pain in left shoulder: Secondary | ICD-10-CM

## 2023-11-01 DIAGNOSIS — M25511 Pain in right shoulder: Secondary | ICD-10-CM

## 2023-11-01 DIAGNOSIS — S233XXA Sprain of ligaments of thoracic spine, initial encounter: Secondary | ICD-10-CM

## 2023-11-01 DIAGNOSIS — S335XXA Sprain of ligaments of lumbar spine, initial encounter: Secondary | ICD-10-CM

## 2024-05-18 ENCOUNTER — Other Ambulatory Visit: Payer: Self-pay

## 2024-05-18 ENCOUNTER — Encounter (HOSPITAL_BASED_OUTPATIENT_CLINIC_OR_DEPARTMENT_OTHER): Payer: Self-pay | Admitting: Emergency Medicine

## 2024-05-18 ENCOUNTER — Emergency Department (HOSPITAL_BASED_OUTPATIENT_CLINIC_OR_DEPARTMENT_OTHER)
Admission: EM | Admit: 2024-05-18 | Discharge: 2024-05-18 | Disposition: A | Discharge status: Home / Self Care | Attending: Emergency Medicine | Admitting: Emergency Medicine

## 2024-05-18 DIAGNOSIS — J069 Acute upper respiratory infection, unspecified: Secondary | ICD-10-CM | POA: Insufficient documentation

## 2024-05-18 DIAGNOSIS — R0981 Nasal congestion: Secondary | ICD-10-CM | POA: Diagnosis present

## 2024-05-18 DIAGNOSIS — N3001 Acute cystitis with hematuria: Secondary | ICD-10-CM | POA: Diagnosis not present

## 2024-05-18 LAB — URINALYSIS, ROUTINE W REFLEX MICROSCOPIC
Bilirubin Urine: NEGATIVE
Glucose, UA: NEGATIVE mg/dL
Ketones, ur: NEGATIVE mg/dL
Nitrite: NEGATIVE
Protein, ur: 30 mg/dL — AB
Specific Gravity, Urine: 1.02 (ref 1.005–1.030)
pH: 8.5 — ABNORMAL HIGH (ref 5.0–8.0)

## 2024-05-18 LAB — URINALYSIS, MICROSCOPIC (REFLEX)

## 2024-05-18 LAB — PREGNANCY, URINE: Preg Test, Ur: NEGATIVE

## 2024-05-18 LAB — RESP PANEL BY RT-PCR (RSV, FLU A&B, COVID)  RVPGX2
Influenza A by PCR: NEGATIVE
Influenza B by PCR: NEGATIVE
Resp Syncytial Virus by PCR: NEGATIVE
SARS Coronavirus 2 by RT PCR: NEGATIVE

## 2024-05-18 MED ORDER — ACETAMINOPHEN 325 MG PO TABS
650.0000 mg | ORAL_TABLET | Freq: Once | ORAL | Status: AC | PRN
  Administered 2024-05-18: 650 mg via ORAL
  Filled 2024-05-18: qty 2

## 2024-05-18 MED ORDER — CEPHALEXIN 500 MG PO CAPS: 500.0000 mg | ORAL_CAPSULE | Freq: Two times a day (BID) | ORAL | 0 refills | Status: AC

## 2024-05-18 NOTE — Discharge Instructions (Addendum)
 Home to rest and hydrate. Take Keflex  as prescribed and complete the full course. You can take Mucinex as needed as directed for your cold symptoms. Motrin  and Tylenol  as instructed for fever and bodyaches.  Recheck with your primary care provider in 2 days. Return to the ER for worsening or concerning symptoms.

## 2024-05-18 NOTE — ED Triage Notes (Signed)
 Congestion and feeling bad x 2 days with some lower back pain denies injury or dysuria or vag d/c has had a small cough

## 2024-05-18 NOTE — ED Provider Notes (Signed)
 [facility] Provider Note   CSN: [id number] Arrival date & time: 05/18/24  9092     Patient presents with: Nasal Congestion and Fever   Julie Finley is a 28 y.o. female.   28 year old female presents with complaint of left-sided back pain, cough, congestion, sore throat.  States that she was exposed to her kids who had a cold the other week.  Arrives with fever of 101.5.  Provided with Tylenol .  Denies dysuria or frequency.  No significant past medical history.  No other complaints or concerns.       Prior to Admission medications   Medication Sig Start Date End Date Taking? Authorizing Provider  cephALEXin  (KEFLEX ) 500 MG capsule Take 1 capsule (500 mg total) by mouth 2 (two) times daily for 5 days. 05/18/24 05/23/24 Yes Beverley Leita LABOR, PA-C  Prenatal Vit-Fe Fumarate-FA (PRENATAL PO) Take 1 tablet by mouth daily.    [provider]    Allergies: Patient has no known allergies.    Review of Systems Negative except as per HPI Updated Vital Signs BP 120/78 (BP Location: Right Arm)   Pulse 98   Temp 99.2 F (37.3 C) (Oral)   Resp 15   Ht 5' 6 (1.676 m)   Wt 120.2 kg   SpO2 100%   BMI 42.77 kg/m   Physical Exam Vitals and nursing note reviewed.  Constitutional:      General: She is not in acute distress.    Appearance: She is well-developed. She is not diaphoretic.  HENT:     Head: Normocephalic and atraumatic.     Right Ear: Tympanic membrane and ear canal normal. There is no impacted cerumen. Tympanic membrane is not injected or erythematous.     Left Ear: Ear canal normal.  No middle ear effusion. There is no impacted cerumen. Tympanic membrane is erythematous. Tympanic membrane is not injected.     Nose: Congestion present.     Mouth/Throat:     Mouth: Mucous membranes are moist.     Pharynx: Posterior oropharyngeal erythema present. No oropharyngeal exudate.  Eyes:     Conjunctiva/sclera: Conjunctivae  normal.  Cardiovascular:     Rate and Rhythm: Normal rate and regular rhythm.     Heart sounds: Normal heart sounds.  Pulmonary:     Effort: Pulmonary effort is normal.     Breath sounds: Normal breath sounds.  Musculoskeletal:        General: Tenderness present.     Cervical back: Neck supple.     Thoracic back: No tenderness or bony tenderness.     Lumbar back: Tenderness present. No bony tenderness.       Back:  Lymphadenopathy:     Cervical: No cervical adenopathy.  Skin:    General: Skin is warm and dry.     Findings: No erythema or rash.  Neurological:     Mental Status: She is alert and oriented to person, place, and time.  Psychiatric:        Behavior: Behavior normal.     (all labs ordered are listed, but only abnormal results are displayed) Labs Reviewed  URINALYSIS, ROUTINE W REFLEX MICROSCOPIC - Abnormal; Notable for the following components:      Result Value   pH 8.5 (*)    Hgb urine dipstick SMALL (*)    Protein, ur 30 (*)    Leukocytes,Ua SMALL (*)    All other components within normal limits  URINALYSIS, MICROSCOPIC (REFLEX) -  Abnormal; Notable for the following components:   Bacteria, UA RARE (*)    All other components within normal limits  RESP PANEL BY RT-PCR (RSV, FLU A&B, COVID)  [id number]  PREGNANCY, URINE    EKG: None  Radiology: No results found.   Procedures   Medications Ordered in the ED  acetaminophen  (TYLENOL ) tablet 650 mg (650 mg Oral Given 05/18/24 0925)                                    Medical Decision Making Amount and/or Complexity of Data Reviewed Labs: ordered.  Risk OTC drugs. Prescription drug management.   This patient presents to the ED for concern of left lower back pain, cough, congestion, sore throat and fever, this involves an extensive number of treatment options, and is a complaint that carries with it a high risk of complications and morbidity.  The differential diagnosis includes but not limited to  viral illness such as COVID, flu, RSV.  Urinary tract infection.  Pyelonephritis.   Co morbidities / Chronic conditions that complicate the patient evaluation  Pregnancy-induced hypertension only, otherwise no history of hypertension.   Additional history obtained:  Additional history obtained from EMR External records from outside source obtained and reviewed including prior history on file   Lab Tests:  I Ordered, and personally interpreted labs.  The pertinent results include: hCG negative.  Negative for COVID, flu, RSV.  Urinalysis with small hemoglobin, protein and leukocytes with 6-10 white cells and rare bacteria   Problem List / ED Course / Critical interventions / Medication management  28 year old female with URI symptoms and left back pain.  Negative for COVID, flu, RSV.  Suspect viral illness similar to what her children had about a week ago.  Regarding her back pain, nonspecific, will try Keflex  for possible urinary tract infection.  Recommend return for worsening or concerning symptoms and recheck with PCP. I ordered medication including Tylenol  Reevaluation of the patient after these medicines showed that the patient fever improved I have reviewed the patients home medicines and have made adjustments as needed  Social Determinants of Health:  Has PCP   Test / Admission - Considered:  Stable for discharge      Final diagnoses:  Viral upper respiratory tract infection  Acute cystitis with hematuria    ED Discharge Orders          Ordered    cephALEXin  (KEFLEX ) 500 MG capsule  2 times daily        05/18/24 1144               Beverley Leita LABOR, PA-C 05/18/24 1202    Julie Jayson LABOR, DO 05/19/24 1542
